# Patient Record
Sex: Female | Born: 1970 | Race: White | Hispanic: No | Marital: Married | Smoking: Never smoker
Health system: Southern US, Community
[De-identification: ages and names within clinical notes are randomized; demographics above are authoritative.]

## PROBLEM LIST (undated history)

## (undated) DIAGNOSIS — E039 Hypothyroidism, unspecified: Secondary | ICD-10-CM

## (undated) DIAGNOSIS — E785 Hyperlipidemia, unspecified: Secondary | ICD-10-CM

## (undated) DIAGNOSIS — I10 Essential (primary) hypertension: Secondary | ICD-10-CM

## (undated) DIAGNOSIS — T7840XA Allergy, unspecified, initial encounter: Secondary | ICD-10-CM

## (undated) DIAGNOSIS — F410 Panic disorder [episodic paroxysmal anxiety] without agoraphobia: Secondary | ICD-10-CM

## (undated) DIAGNOSIS — K219 Gastro-esophageal reflux disease without esophagitis: Secondary | ICD-10-CM

## (undated) DIAGNOSIS — E669 Obesity, unspecified: Secondary | ICD-10-CM

## (undated) DIAGNOSIS — J302 Other seasonal allergic rhinitis: Secondary | ICD-10-CM

## (undated) DIAGNOSIS — E079 Disorder of thyroid, unspecified: Secondary | ICD-10-CM

## (undated) HISTORY — DX: Hyperlipidemia, unspecified: E78.5

## (undated) HISTORY — DX: Disorder of thyroid, unspecified: E07.9

## (undated) HISTORY — DX: Panic disorder (episodic paroxysmal anxiety): F41.0

## (undated) HISTORY — DX: Gastro-esophageal reflux disease without esophagitis: K21.9

## (undated) HISTORY — DX: Obesity, unspecified: E66.9

---
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
## 2006-08-08 ENCOUNTER — Other Ambulatory Visit: Admission: RE | Admit: 2006-08-08 | Discharge: 2006-08-08 | Payer: Self-pay | Admitting: Family Medicine

## 2008-06-29 ENCOUNTER — Encounter: Payer: Self-pay | Admitting: Family Medicine

## 2008-06-29 ENCOUNTER — Encounter (INDEPENDENT_AMBULATORY_CARE_PROVIDER_SITE_OTHER): Payer: Self-pay | Admitting: *Deleted

## 2008-06-29 LAB — CONVERTED CEMR LAB
AST: 21 units/L
Albumin: 3.8 g/dL
Alkaline Phosphatase: 62 units/L
Chloride, Serum: 107 mmol/L
Creatinine, Ser: 0.9 mg/dL
MCH: 34.5 pg
MCV: 86.6 fL
Platelets: 248 10*3/uL
Potassium, serum: 5.9 mmol/L
TSH: 4.15 microintl units/mL
Total Bilirubin: 0.8 mg/dL
Total Protein: 7.2 g/dL

## 2008-08-15 ENCOUNTER — Encounter (INDEPENDENT_AMBULATORY_CARE_PROVIDER_SITE_OTHER): Payer: Self-pay | Admitting: *Deleted

## 2008-08-31 ENCOUNTER — Ambulatory Visit: Payer: Self-pay | Admitting: Family Medicine

## 2008-08-31 DIAGNOSIS — R7301 Impaired fasting glucose: Secondary | ICD-10-CM

## 2008-08-31 DIAGNOSIS — E875 Hyperkalemia: Secondary | ICD-10-CM

## 2008-08-31 DIAGNOSIS — K219 Gastro-esophageal reflux disease without esophagitis: Secondary | ICD-10-CM | POA: Insufficient documentation

## 2008-08-31 DIAGNOSIS — E559 Vitamin D deficiency, unspecified: Secondary | ICD-10-CM | POA: Insufficient documentation

## 2008-08-31 LAB — CONVERTED CEMR LAB
BUN: 15 mg/dL (ref 6–23)
Calcium: 9.1 mg/dL (ref 8.4–10.5)
Creatinine, Ser: 0.8 mg/dL (ref 0.4–1.2)
GFR calc non Af Amer: 84.37 mL/min (ref >60)
Hgb A1c MFr Bld: 5.8 % (ref 4.6–6.5)

## 2008-09-01 ENCOUNTER — Encounter (INDEPENDENT_AMBULATORY_CARE_PROVIDER_SITE_OTHER): Payer: Self-pay | Admitting: *Deleted

## 2008-09-01 ENCOUNTER — Telehealth (INDEPENDENT_AMBULATORY_CARE_PROVIDER_SITE_OTHER): Payer: Self-pay | Admitting: *Deleted

## 2008-09-08 ENCOUNTER — Encounter (INDEPENDENT_AMBULATORY_CARE_PROVIDER_SITE_OTHER): Payer: Self-pay | Admitting: *Deleted

## 2008-12-16 ENCOUNTER — Ambulatory Visit: Payer: Self-pay | Admitting: Family Medicine

## 2008-12-20 ENCOUNTER — Telehealth: Payer: Self-pay | Admitting: Family Medicine

## 2008-12-21 ENCOUNTER — Encounter: Payer: Self-pay | Admitting: Family Medicine

## 2008-12-22 ENCOUNTER — Telehealth: Payer: Self-pay | Admitting: Family Medicine

## 2009-05-08 ENCOUNTER — Telehealth (INDEPENDENT_AMBULATORY_CARE_PROVIDER_SITE_OTHER): Payer: Self-pay | Admitting: *Deleted

## 2009-06-26 ENCOUNTER — Encounter: Payer: Self-pay | Admitting: Family Medicine

## 2009-09-04 ENCOUNTER — Ambulatory Visit: Payer: Self-pay | Admitting: Family Medicine

## 2009-09-04 DIAGNOSIS — R209 Unspecified disturbances of skin sensation: Secondary | ICD-10-CM | POA: Insufficient documentation

## 2009-09-04 DIAGNOSIS — R0609 Other forms of dyspnea: Secondary | ICD-10-CM

## 2009-09-04 DIAGNOSIS — F411 Generalized anxiety disorder: Secondary | ICD-10-CM | POA: Insufficient documentation

## 2009-09-04 DIAGNOSIS — R0989 Other specified symptoms and signs involving the circulatory and respiratory systems: Secondary | ICD-10-CM

## 2009-09-08 ENCOUNTER — Ambulatory Visit: Payer: Self-pay | Admitting: Family Medicine

## 2009-09-08 LAB — CONVERTED CEMR LAB
BUN: 18 mg/dL (ref 6–23)
Basophils Absolute: 0 10*3/uL (ref 0.0–0.1)
Bilirubin, Direct: 0.2 mg/dL (ref 0.0–0.3)
CO2: 26 meq/L (ref 19–32)
Calcium: 9.2 mg/dL (ref 8.4–10.5)
Chloride: 106 meq/L (ref 96–112)
Cholesterol: 191 mg/dL (ref 0–200)
Creatinine, Ser: 0.7 mg/dL (ref 0.4–1.2)
Eosinophils Absolute: 0.2 10*3/uL (ref 0.0–0.7)
HDL: 44.2 mg/dL (ref >39.00)
LDL Cholesterol: 127 mg/dL — ABNORMAL HIGH (ref 0–99)
Lymphocytes Relative: 22.6 % (ref 12.0–46.0)
MCHC: 34.1 g/dL (ref 30.0–36.0)
MCV: 87.1 fL (ref 78.0–100.0)
Monocytes Absolute: 0.3 10*3/uL (ref 0.1–1.0)
Neutrophils Relative %: 69.3 % (ref 43.0–77.0)
Platelets: 206 10*3/uL (ref 150.0–400.0)
RBC: 4.59 M/uL (ref 3.87–5.11)
TSH: 3.5 microintl units/mL (ref 0.35–5.50)
Total Bilirubin: 0.9 mg/dL (ref 0.3–1.2)
Total CHOL/HDL Ratio: 4
Triglycerides: 101 mg/dL (ref 0.0–149.0)
VLDL: 20.2 mg/dL (ref 0.0–40.0)

## 2009-09-12 ENCOUNTER — Telehealth (INDEPENDENT_AMBULATORY_CARE_PROVIDER_SITE_OTHER): Payer: Self-pay | Admitting: *Deleted

## 2009-10-02 ENCOUNTER — Ambulatory Visit: Payer: Self-pay | Admitting: Family Medicine

## 2009-10-02 DIAGNOSIS — E119 Type 2 diabetes mellitus without complications: Secondary | ICD-10-CM

## 2009-10-02 DIAGNOSIS — E785 Hyperlipidemia, unspecified: Secondary | ICD-10-CM

## 2009-10-02 DIAGNOSIS — E1169 Type 2 diabetes mellitus with other specified complication: Secondary | ICD-10-CM

## 2009-11-14 ENCOUNTER — Encounter: Payer: Self-pay | Admitting: Family Medicine

## 2009-11-16 ENCOUNTER — Encounter (INDEPENDENT_AMBULATORY_CARE_PROVIDER_SITE_OTHER): Payer: Self-pay | Admitting: *Deleted

## 2009-12-01 ENCOUNTER — Ambulatory Visit: Payer: Self-pay | Admitting: Family Medicine

## 2009-12-04 LAB — CONVERTED CEMR LAB
ALT: 15 units/L (ref 0–35)
AST: 27 units/L (ref 0–37)
Albumin: 4.5 g/dL (ref 3.5–5.2)
Bilirubin, Direct: 0.2 mg/dL (ref 0.0–0.3)
Hgb A1c MFr Bld: 5.7 % — ABNORMAL HIGH (ref <5.7)
Total Protein: 7.1 g/dL (ref 6.0–8.3)

## 2009-12-20 ENCOUNTER — Encounter
Admission: RE | Admit: 2009-12-20 | Discharge: 2010-02-13 | Payer: Self-pay | Source: Home / Self Care | Attending: Family Medicine | Admitting: Family Medicine

## 2009-12-20 ENCOUNTER — Encounter: Payer: Self-pay | Admitting: Family Medicine

## 2010-02-14 NOTE — Assessment & Plan Note (Signed)
Summary: 4 WEEK FOLLOWUP APPT///SP[facility]   Vital Signs:  Patient profile:   40 year old female Height:      65 inches (165.10 cm) Weight:      290.38 pounds (131.99 kg) BMI:     48.50 Temp:     98.8 degrees F (37.11 degrees C) oral BP sitting:   112 / 70  (left arm) Cuff size:   large  Vitals Entered By: Lucious Groves CMA (October 02, 2009 4:09 PM) CC: F/U to discuss DM and anxiety./kb Is Patient Diabetic? Yes Pain Assessment Patient in pain? no      Comments Patient notes that she feels like her anxiety is "getting there"./kb   History of Present Illness: 40 yo woman here today for  1) anxiety- pt's husband has noticed improvement, pt feels calmer.  still having catastrophic thinking but able to 'let it go'.  has not called therapist.  denies SI/HI.  tolerating meds w/out side effects.  less crying, reports better composure- evident in office today.  2) DM- pt familiar w/ the dx and has already made dramatic dietary changes, resulting in weight loss.  insurance covers appts w/ nutrition and would like to take advantage of this.  would like to manage dz w/ diet and exercise if possible.  3) Hyperlipidemia- started Simvastatin w/out difficulty.  no N/V, abd pain, myalgias.  given DM, pt's goal is <70.  Problems Prior to Update: 1)  Diabetes-type 2  (ICD-250.00) 2)  Anxiety State, Unspecified  (ICD-300.00) 3)  Paresthesia  (ICD-782.0) 4)  Snoring  (ICD-786.09) 5)  Impaired Fasting Glucose  (ICD-790.21) 6)  Hyperkalemia  (ICD-276.7) 7)  Vitamin D Deficiency  (ICD-268.9) 8)  Gerd  (ICD-530.81)  Current Medications (verified): 1)  Omeprazole 20 Mg Cpdr (Omeprazole) .Marland Kitchen.. 1 By Mouth Qam 2)  Calcium 500 +d 500-400 Mg-Unit Tabs (Calcium-Vitamin D) .... Take 2 Tablets Daily 3)  Claritin-D 24 Hour 10-240 Mg Xr24h-Tab (Loratadine-Pseudoephedrine) .... Take One Tablet Daily 4)  Citalopram Hydrobromide 20 Mg Tabs (Citalopram Hydrobromide) .... Take One Tablet By Mouth Daily 5)   Simvastatin 40 Mg Tabs (Simvastatin) .... Take One Tablet At Bedtime  Allergies (verified): No Known Drug Allergies  Past History:  Past Medical History: GERD Vitamin D def Obesity DM- dx'd 9/11 hyperlipidemia  Review of Systems      See HPI  Physical Exam  General:  Well-developed,well-nourished,in no acute distress; alert,appropriate and cooperative throughout examination.  obese Neck:  No deformities, masses, or tenderness noted. Lungs:  Normal respiratory effort, chest expands symmetrically. Lungs are clear to auscultation, no crackles or wheezes. Heart:  Normal rate and regular rhythm. S1 and S2 normal without gallop, murmur, click, rub or other extra sounds. Psych:  much less anxious.  better composed.   Impression & Recommendations:  Problem # 1:  DIABETES-TYPE 2 (ICD-250.00) Assessment New discussed meaning of dx, general nutrition principles, importance of exercise.  need for eye exam yearly.  reviewed schedule of visits- quarterly.  will refer to nutrition.  no need for meds at this time.  will attempt control via diet and exercise. Orders: Nutrition Referral (Nutrition)  Problem # 2:  HYPERLIPIDEMIA (ICD-272.4) Assessment: New given new dx of DM, goal is now <70.  pt tolerating Simvastatin w/out difficulty.  will follow closely. Her updated medication list for this problem includes:    Simvastatin 40 Mg Tabs (Simvastatin) .Marland Kitchen... Take one tablet at bedtime  Problem # 3:  ANXIETY STATE, UNSPECIFIED (ICD-300.00) Assessment: Improved no side effects from SSRI.  family reporting improvement.  pt acknowledges improvement as well.  again encouraged pt to start counseling.  will follow closely. Her updated medication list for this problem includes:    Citalopram Hydrobromide 20 Mg Tabs (Citalopram hydrobromide) .Marland Kitchen... Take one tablet by mouth daily  Complete Medication List: 1)  Omeprazole 20 Mg Cpdr (Omeprazole) .Marland Kitchen.. 1 by mouth qam 2)  Calcium 500 +d 500-400 Mg-unit  Tabs (Calcium-vitamin d) .... Take 2 tablets daily 3)  Claritin-d 24 Hour 10-240 Mg Xr24h-tab (Loratadine-pseudoephedrine) .... Take one tablet daily 4)  Citalopram Hydrobromide 20 Mg Tabs (Citalopram hydrobromide) .... Take one tablet by mouth daily 5)  Simvastatin 40 Mg Tabs (Simvastatin) .... Take one tablet at bedtime  Patient Instructions: 1)  Please schedule a follow-up appointment in 2 months for your complete physical- you do not need to fast for this. 2)  Keep up the great work on diet and exercise!  I'm so proud of you! 3)  Someone will call you with your nutrition appt 4)  Call with any questions or concerns- that's what I'm here for! 5)  Continue your Citalopram daily 6)  Take the Simvastatin as directed at night 7)  At the next visit we'll do urine to check for protein and a complete foot exam, along w/ GYN 8)  Hang in there!!!  You're doing great! 9)

## 2010-02-14 NOTE — Letter (Signed)
Summary: Care Consideration Regarding Sleep Apnea/[facility]  Care Consideration Regarding Sleep Apnea/ [facility]   Imported By: Lanelle Bal 07/12/2009 10:29:08  _____________________________________________________________________  External Attachment:    Type:   Image     Comment:   External Document

## 2010-02-14 NOTE — Miscellaneous (Signed)
  Clinical Lists Changes  Observations: Added new observation of FLU VAX: Historical (11/14/2009 9:01)      Immunization History:  Influenza Immunization History:    Influenza:  historical (11/14/2009)

## 2010-02-14 NOTE — Assessment & Plan Note (Signed)
Summary: CPX--DOESNT NEED LABS///SP[facility]   Vital Signs:  Patient profile:   40 year old female Height:      65 inches Weight:      288 pounds BMI:     48.10 Pulse rate:   82 / minute BP sitting:   108 / 78  (left arm)  Vitals Entered By: Doristine Devoid CMA (December 01, 2009 3:38 PM) CC: CPX    History of Present Illness: 40 yo woman here today for CPE.  no concerns about health.  overdue on mammogram.  started period today so no pap.  Preventive Screening-Counseling & Management  Alcohol-Tobacco     Alcohol drinks/day: 0     Smoking Status: never  Caffeine-Diet-Exercise     Does Patient Exercise: no      Sexual History:  currently monogamous.        Drug Use:  never.    Current Medications (verified): 1)  Omeprazole 20 Mg Cpdr (Omeprazole) .Marland Kitchen.. 1 By Mouth Qam 2)  Calcium 500 +d 500-400 Mg-Unit Tabs (Calcium-Vitamin D) .... Take 2 Tablets Daily 3)  Claritin-D 24 Hour 10-240 Mg Xr24h-Tab (Loratadine-Pseudoephedrine) .... Take One Tablet Daily 4)  Citalopram Hydrobromide 20 Mg Tabs (Citalopram Hydrobromide) .... Take One Tablet By Mouth Daily 5)  Simvastatin 40 Mg Tabs (Simvastatin) .... Take One Tablet At Bedtime 6)  Tylenol Pm Extra Strength 500-25 Mg Tabs (Diphenhydramine-Apap (Sleep)) .... 2 Tablets As Needed  Allergies (verified): No Known Drug Allergies  Past History:  Past medical, surgical, family and social histories (including risk factors) reviewed, and no changes noted (except as noted below).  Past Medical History: Reviewed history from 10/02/2009 and no changes required. GERD Vitamin D def Obesity DM- dx'd 9/11 hyperlipidemia  Past Surgical History: Reviewed history from 08/31/2008 and no changes required. none  Family History: Reviewed history from 08/31/2008 and no changes required. CAD-no HTN-no DM-maternal and paternal grandparents STROKE-no COLON CA-no BREAST CA-no  Social History: Reviewed history from 08/31/2008 and no changes  required. married, 2 children (95,00) Runner, broadcasting/film/video at [employer]  Review of Systems  The patient denies anorexia, fever, weight loss, weight gain, vision loss, decreased hearing, hoarseness, chest pain, syncope, dyspnea on exertion, peripheral edema, prolonged cough, headaches, abdominal pain, melena, hematochezia, severe indigestion/heartburn, hematuria, suspicious skin lesions, depression, abnormal bleeding, enlarged lymph nodes, and breast masses.    Physical Exam  General:  Well-developed,well-nourished,in no acute distress; alert,appropriate and cooperative throughout examination.  obese Head:  Normocephalic and atraumatic without obvious abnormalities. No apparent alopecia or balding. Eyes:  No corneal or conjunctival inflammation noted. EOMI. Perrla. Funduscopic exam benign, without hemorrhages, exudates or papilledema. Vision grossly normal. Ears:  External ear exam shows no significant lesions or deformities.  Otoscopic examination reveals clear canals, tympanic membranes are intact bilaterally without bulging, retraction, inflammation or discharge. Hearing is grossly normal bilaterally. Nose:  External nasal examination shows no deformity or inflammation. Nasal mucosa are pink and moist without lesions or exudates. Mouth:  Oral mucosa and oropharynx without lesions or exudates.  Teeth in good repair. Neck:  No deformities, masses, or tenderness noted. Breasts:  No mass, nodules, thickening, tenderness, bulging, retraction, inflamation, nipple discharge or skin changes noted.   Lungs:  Normal respiratory effort, chest expands symmetrically. Lungs are clear to auscultation, no crackles or wheezes. Heart:  Normal rate and regular rhythm. S1 and S2 normal without gallop, murmur, click, rub or other extra sounds. Abdomen:  Bowel sounds positive,abdomen soft and non-tender without masses, organomegaly or hernias noted. Genitalia:  deferred due to menses Pulses:  +2 carotid, radial,  DP Extremities:  no C/C/E Neurologic:  No cranial nerve deficits noted. Station and gait are normal. Plantar reflexes are down-going bilaterally. DTRs are symmetrical throughout. Sensory, motor and coordinative functions appear intact. Skin:  Intact without suspicious lesions or rashes Cervical Nodes:  No lymphadenopathy noted Axillary Nodes:  No palpable lymphadenopathy Psych:  Cognition and judgment appear intact. Alert and cooperative with normal attention span and concentration. No apparent delusions, illusions, hallucinations   Impression & Recommendations:  Problem # 1:  PHYSICAL EXAMINATION (ICD-V70.0) Assessment New pt's PE WNL w/ exception of obesity.  will refer for mammogram.  anticipatory guidance provided.  Problem # 2:  HYPERLIPIDEMIA (ICD-272.4) Assessment: Unchanged due for f/u LFTs after starting statin. Her updated medication list for this problem includes:    Simvastatin 40 Mg Tabs (Simvastatin) .Marland Kitchen... Take one tablet at bedtime  Orders: Specimen Handling (13244)  Problem # 3:  DIABETES-TYPE 2 (ICD-250.00) Assessment: Unchanged due for A1C Orders: Venipuncture (01027) Specimen Handling (25366)  Complete Medication List: 1)  Omeprazole 20 Mg Cpdr (Omeprazole) .Marland Kitchen.. 1 by mouth qam 2)  Calcium 500 +d 500-400 Mg-unit Tabs (Calcium-vitamin d) .... Take 2 tablets daily 3)  Claritin-d 24 Hour 10-240 Mg Xr24h-tab (Loratadine-pseudoephedrine) .... Take one tablet daily 4)  Citalopram Hydrobromide 20 Mg Tabs (Citalopram hydrobromide) .... Take one tablet by mouth daily 5)  Simvastatin 40 Mg Tabs (Simvastatin) .... Take one tablet at bedtime 6)  Tylenol Pm Extra Strength 500-25 Mg Tabs (Diphenhydramine-apap (sleep)) .... 2 tablets as needed  Patient Instructions: 1)  Follow up in 3 months to recheck your diabetes and cholesterol- do not eat before this appt 2)  Schedule your pap at your convenience 3)  We'll notify you of your lab results 4)  Keep up the good work on  diet, try and get regular exercise! 5)  Call with any questions or concerns 6)  We'll call you with your mammogram appt 7)  Happy Holidays!!! Prescriptions: SIMVASTATIN 40 MG TABS (SIMVASTATIN) take one tablet at bedtime  #30 x 11   Entered and Authorized by:   Neena Rhymes MD   Signed by:   Neena Rhymes MD on 12/01/2009   Method used:   Electronically to        [facility] #[id number]* (retail)       [address]       [place], [place]  [address]       Ph: [phone]       Fax: [phone]   RxID:   [id number] CITALOPRAM HYDROBROMIDE 20 MG TABS (CITALOPRAM HYDROBROMIDE) take one tablet by mouth daily  #30 x 11   Entered and Authorized by:   Neena Rhymes MD   Signed by:   Neena Rhymes MD on 12/01/2009   Method used:   Electronically to        [facility] #[id number]* (retail)       [address]       [place], [place]  [address]       Ph: [phone]       Fax: [phone]   RxID:   [id number] OMEPRAZOLE 20 MG CPDR (OMEPRAZOLE) 1 by mouth qam  #30 x 11   Entered and Authorized by:   Neena Rhymes MD   Signed by:   Neena Rhymes MD on 12/01/2009   Method  used:   Electronically to        [facility] #[id number]* (retail)       [address]       [place], [place]  [address]       Ph: [phone]       Fax: [phone]   RxID:   [id number]    Orders Added: 1)  Venipuncture [72536] 2)  Specimen Handling [99000] 3)  Est. Patient 40-64 years [phone]

## 2010-02-14 NOTE — Progress Notes (Signed)
Summary: pt ok prilosec otc  Phone Note Refill Request Call back at [phone] Message from:  Pharmacy on May 08, 2009 10:56 AM  Refills Requested: Medication #1:  OMEPRAZOLE 20 MG CPDR 1 by mouth qam   Dosage confirmed as above?Dosage Confirmed   Supply Requested: 1 month   Last Refilled: 03/27/2009 Prior Auth [id number] ID#: [id number]  Next Appointment Scheduled: none Initial call taken by: Harold Barban,  May 08, 2009 10:57 AM  Follow-up for Phone Call        prior Berkley Harvey was denied back in December pt is aware, pt is to have tried and failed  OTC Prilosec and pt is aware per 12/0/10 office ntoes.  Lmom for pt to call  .Kandice Hams  May 09, 2009 12:57 PM  patient called says she is ok w/ taking otc prilosec says she has taking it in the past so this won't be a problem...Marland KitchenMarland KitchenDoristine Devoid  May 09, 2009 4:17 PM

## 2010-02-14 NOTE — Assessment & Plan Note (Signed)
Summary: concerns about snoring and anxiety/kn   Vital Signs:  Patient profile:   40 year old female Height:      65 inches Weight:      297 pounds BMI:     49.60 Pulse rate:   102 / minute BP sitting:   140 / 90  (left arm)  Vitals Entered By: Doristine Devoid CMA (September 04, 2009 3:45 PM) CC: Concerns about snoring, numbness in arms and legs, and some anxiety issues    History of Present Illness: 40 yo woman here today for   1) snoring- husband is complaining about pt's snoring.  denies apnic episodes.  denies daytime sleepiness or waking feeling poorly rested.  2) numbness- occurs when pt lies down at night.  has 'pricklies' in fingers and legs.  occuring bilaterally.  not occuring nightly but 'regularly'.  'easily 3' out of 7 nights.  sxs are gone by the time pt wakes up.  when it occurs it 'sends me into a panic mode'.  3) anxiety- had 1 'full fledged panic attack'.  heart racing, SOB, 'whole body numbness'.  'i'm spending a lot of time thinking about dying in my sleep'.  no hx of anxiety or depression previously.  anxiety sxs 'for most of this calendar year'.  worsening in severity.  denies any change in stressors w/ exception of daughter now driving.  lost grandmother in November.  reports she's thinking about death 'all the time'.  Preventive Screening-Counseling & Management  Caffeine-Diet-Exercise     Does Patient Exercise: no  Current Medications (verified): 1)  Omeprazole 20 Mg Cpdr (Omeprazole) .Marland Kitchen.. 1 By Mouth Qam 2)  Calcium 500 +d 500-400 Mg-Unit Tabs (Calcium-Vitamin D) .... Take 2 Tablets Daily 3)  Claritin-D 24 Hour 10-240 Mg Xr24h-Tab (Loratadine-Pseudoephedrine) .... Take One Tablet Daily 4)  Citalopram Hydrobromide 20 Mg Tabs (Citalopram Hydrobromide) .... Take One Tablet By Mouth Daily  Allergies (verified): No Known Drug Allergies  Past History:  Social History: Last updated: 08/31/2008 married, 2 children (95,00) Runner, broadcasting/film/video at [employer]  Past  Medical History: GERD Vitamin D def Obesity  Review of Systems      See HPI  Physical Exam  General:  Well-developed,well-nourished,in no acute distress; alert,appropriate and cooperative throughout examination.  obese Mouth:  crowded post pharynx Neck:  No deformities, masses, or tenderness noted. Lungs:  Normal respiratory effort, chest expands symmetrically. Lungs are clear to auscultation, no crackles or wheezes. Heart:  Normal rate and regular rhythm. S1 and S2 normal without gallop, murmur, click, rub or other extra sounds. Pulses:  +2 carotid, radial, DP Extremities:  no C/C/E Neurologic:  No cranial nerve deficits noted. Station and gait are normal. Plantar reflexes are down-going bilaterally. DTRs are symmetrical throughout. Sensory, motor and coordinative functions appear intact. Psych:  very anxious.  intermittantly tearful   Impression & Recommendations:  Problem # 1:  SNORING (ICD-786.09) Assessment New  given habitus and crowded post pharynx will refer to pulm for sleep study.  pt in agreement.  Orders: Pulmonary Referral (Pulmonary)  Problem # 2:  PARESTHESIA (ICD-782.0) Assessment: New  likely related to the way pt is sleeping as it only occurs at night.  will refer for nerve conduction study.  Orders: Neurology Referral (Neuro)  Problem # 3:  ANXIETY STATE, UNSPECIFIED (ICD-300.00) Assessment: New this is pt's biggest problem.  based on her sxs it sounds as if she wouldn't leave the house unless she had to work, preoccupied w/ death.  #s given for counseling.  start SSRI.  follow closely. Her updated medication list for this problem includes:    Citalopram Hydrobromide 20 Mg Tabs (Citalopram hydrobromide) .Marland Kitchen... Take one tablet by mouth daily  Complete Medication List: 1)  Omeprazole 20 Mg Cpdr (Omeprazole) .Marland Kitchen.. 1 by mouth qam 2)  Calcium 500 +d 500-400 Mg-unit Tabs (Calcium-vitamin d) .... Take 2 tablets daily 3)  Claritin-d 24 Hour 10-240 Mg Xr24h-tab  (Loratadine-pseudoephedrine) .... Take one tablet daily 4)  Citalopram Hydrobromide 20 Mg Tabs (Citalopram hydrobromide) .... Take one tablet by mouth daily  Patient Instructions: 1)  Please schedule a follow-up appointment in 3-4 weeks to recheck anxiety. 2)  Please schedule a fasting lab visit 3)  BMP- 278.01 4)  CBC- 782.0 5)  TSH- 782.0 6)  FLP- 278.01 7)  LFT- 278.01 8)  Start the Citalopram daily.  If you find it is causing fatigue, take it at night 9)  Someone will call you with your sleep and nerve conduction study appts 10)  Please call and establish counseling- this is the most important part 11)  Try and find a stress outlet- walking, painting, etc 12)  Hang in there!!!  Prescriptions: CITALOPRAM HYDROBROMIDE 20 MG TABS (CITALOPRAM HYDROBROMIDE) take one tablet by mouth daily  #30 x 3   Entered and Authorized by:   Neena Rhymes MD   Signed by:   Neena Rhymes MD on 09/04/2009   Method used:   Electronically to        [facility] #[id number]* (retail)       [address]       [place], [place]  [address]       Ph: [phone]       Fax: [phone]   RxID:   [id number]

## 2010-02-14 NOTE — Miscellaneous (Signed)
Summary[facility]   Imported By: Lanelle Bal 11/21/2009 13:15:22  _____________________________________________________________________  External Attachment:    Type:   Image     Comment:   External Document

## 2010-02-14 NOTE — Progress Notes (Signed)
Summary: labs  Phone Note Outgoing Call   Call placed by: Doristine Devoid CMA,  September 12, 2009 9:36 AM Call placed to: Patient Summary of Call: f pt is truly diabetic will need to start cholesterol med.  if not, her LDL is mildly elevated and she should work on diet and exercise.  repeat labs in 6 months pt is officially diabetic.  due to this, will need to start Simvastatin 40mg  nightly for her cholesterol (goal is now <70).  need to recheck LFTs in 6-8 weeks.  if pt doesn't have an appt scheduled to follow up, will need to schedule to discuss diagnosis new dx   Follow-up for Phone Call        spoke w/ patient aware of labs and that medication is to be started for cholesterol and has upcoming appt to discuss further.....Marland KitchenMarland KitchenDoristine Devoid CMA  September 13, 2009 12:18 PM     New/Updated Medications: SIMVASTATIN 40 MG TABS (SIMVASTATIN) take one tablet at bedtime Prescriptions: SIMVASTATIN 40 MG TABS (SIMVASTATIN) take one tablet at bedtime  #30 x 3   Entered by:   Doristine Devoid CMA   Authorized by:   Neena Rhymes MD   Signed by:   Doristine Devoid CMA on 09/13/2009   Method used:   Electronically to        [facility] #[id number]* (retail)       [address]       [place], [place]  [address]       Ph: [phone]       Fax: [phone]   RxID:   [id number]

## 2010-02-15 NOTE — Letter (Signed)
Summary: [facility]  [facility]   Imported By: Lanelle Bal 12/30/2009 08:46:45  _____________________________________________________________________  External Attachment:    Type:   Image     Comment:   External Document

## 2010-02-24 ENCOUNTER — Encounter: Payer: Self-pay | Admitting: Family Medicine

## 2010-03-05 ENCOUNTER — Ambulatory Visit: Payer: Self-pay | Admitting: Family Medicine

## 2010-03-07 ENCOUNTER — Telehealth: Payer: Self-pay | Admitting: Family Medicine

## 2010-03-09 ENCOUNTER — Encounter: Payer: Self-pay | Admitting: Family Medicine

## 2010-03-20 ENCOUNTER — Encounter (INDEPENDENT_AMBULATORY_CARE_PROVIDER_SITE_OTHER): Payer: Self-pay | Admitting: *Deleted

## 2010-03-22 NOTE — Medication Information (Signed)
Summary: PA and Denial for Omeprazole  PA and Denial for Omeprazole   Imported By: Maryln Gottron 03/16/2010 14:03:56  _____________________________________________________________________  External Attachment:    Type:   Image     Comment:   External Document

## 2010-03-26 ENCOUNTER — Telehealth: Payer: Self-pay | Admitting: Family Medicine

## 2010-03-27 NOTE — Letter (Signed)
Summary: Generic Letter  [facility]  [address] [place], [place] [address]   Phone: [phone]  Fax: [phone]    03/20/2010   Valinda Hoar [place] of [facility] Dept/Provider Courtesy Review [address] [address] [place], [place] [address]  ICA DAYE DOB:02/02/2070  IR:[id number] appeal YT:[id number] [address] [place], [place]  [address]  ATTENTION APPEALS DEPARTMENT:  Mrs. Ishii has diagnosis of GERD. The Omeprazole has been therapeutic in treating the patient GERD. The patient has tried over-the-counter medications such as prilosec with no relief. The Patient requires daily dosing of Omeprazole in order to remain asymptomatic.Please reconsider your decision on coverage for Omeprazole.        Sincerely,       Neena Rhymes

## 2010-03-27 NOTE — Progress Notes (Signed)
Summary: -prior auth DENIED OMEPRAZOLE appeal needed  Phone Note Refill Request Message from:  Fax from Pharmacy on March 07, 2010 11:55 AM  Refills Requested: Medication #1:  OMEPRAZOLE 20 MG CPDR 1 by mouth qam prior Berkley Harvey - [id number] - pt id [id number]  Initial call taken by: Okey Regal [place],  March 07, 2010 11:56 AM  Follow-up for Phone Call        Case FA:[id number], awaiting fax.Marland KitchenMarland KitchenFelecia Deloach CMA  March 08, 2010 11:48 AM  PA faxed back awaiting response...Marland KitchenMarland KitchenFelecia Deloach CMA  March 08, 2010 3:21 PM   Additional Follow-up for Phone Call Additional follow up Details #1::         Reason for denial :Coverage beyond 90 days per 180 days period is provided for severe/atypical gerd with related disorders, moderate gerd with daily/disabling symptoms having failed a 30 day trail of high-dose H2 blockers,or peptic ulcer disease, Barrett's esophagus or hypersecretory conditions, or prevention of NSAID or steroid related ulcer.     Additional Follow-up for Phone Call Additional follow up Details #2::    please ask pt if she is having daily sxs Follow-up by: Neena Rhymes MD,  March 14, 2010 5:18 PM  Additional Follow-up for Phone Call Additional follow up Details #3:: Details for Additional Follow-up Action Taken: Left message to call office...........Marland KitchenFelecia Deloach CMA  March 15, 2010 5:19 PM  Left message to call office ...........Marland KitchenFelecia Deloach CMA  March 19, 2010 8:33 AM   Pt states that she has been experiencing symptoms daily. Pt notes that she has been using the OTC brand, but it does not seem to work as good as the Rx. Pls advise..........Marland KitchenFelecia Deloach CMA  March 19, 2010 4:57 PM   now that pt reports daily sxs she should meet the criteria for the prior authorization.  please see if they will now accept this script.  Neena Rhymes MD  March 20, 2010 8:04 AM.  Prior Berkley Harvey was already initiated and denied see 03-09-10 PA denial folder. If Pt is needing med  a letter of appeal will be required in order to get med approve maybe.........Marland KitchenFelecia Deloach CMA  March 20, 2010 11:46 AM   letter of appeal signed.  will await decision.  Neena Rhymes MD  March 20, 2010 12:59 PM.

## 2010-03-30 ENCOUNTER — Telehealth: Payer: Self-pay | Admitting: Family Medicine

## 2010-04-03 NOTE — Progress Notes (Signed)
Summary: Prior Auth--Pending appeal  Phone Note Refill Request Call back at [phone] Message from:  Pharmacy on March 26, 2010 2:23 PM  Refills Requested: Medication #1:  OMEPRAZOLE 20 MG CPDR 1 by mouth qam   Dosage confirmed as above?Dosage Confirmed   Supply Requested: 1 month Prior Auth : [id number] Patient ID: [id number]  Next Appointment Scheduled: 4.9.12 Initial call taken by: Lavell Islam,  March 26, 2010 2:24 PM  Follow-up for Phone Call        Auth was denied and letter was sent just 6 days ago. Letters for re-consideration usually take 2+ weeks. Lucious Groves CMA  March 26, 2010 3:18 PM     I spoke with [employer] and not enough time has elapsed, they could not tell me any additional info about the status of this request for re-consideration. Lucious Groves CMA  March 26, 2010 3:39 PM   Additional Follow-up for Phone Call Additional follow up Details #1::        I spoke with [employer] to check status again and rep stated that they do not having anything in their system about an appeal. He notes that they do not do the appeals and we would have to contact [employer] appeals for this issue. I faxed appeal letter to number on denial letter to try and achieve faster processing.  Additional Follow-up by: Lucious Groves CMA,  March 30, 2010 11:03 AM

## 2010-04-06 ENCOUNTER — Other Ambulatory Visit: Payer: Self-pay | Admitting: Family Medicine

## 2010-04-06 NOTE — Telephone Encounter (Signed)
I spoke with the pt last week and she purchases this rx out of pocket. Insurance has denied to pay for it.

## 2010-04-06 NOTE — Telephone Encounter (Signed)
This is in regards to her Omeprazole.

## 2010-04-09 ENCOUNTER — Other Ambulatory Visit: Payer: Self-pay | Admitting: Family Medicine

## 2010-04-09 MED ORDER — OMEPRAZOLE 20 MG PO TBEC: 20.0000 mg | DELAYED_RELEASE_TABLET | Freq: Every day | ORAL | Status: DC

## 2010-04-09 NOTE — Telephone Encounter (Signed)
MD previously sent 40mg  to see if it would require prior auth and it did. Pt will take 20mg  and pay for it out of pocket because ins co has denied coverage on initial prior auth AND appeal.

## 2010-04-11 ENCOUNTER — Other Ambulatory Visit: Payer: Self-pay | Admitting: Family Medicine

## 2010-04-11 NOTE — Telephone Encounter (Signed)
Left message on voicemail at the pharmacy to fill rx from yesterday. Prior auth cannot be done, auth and appeal have been denied by ins co and pt is aware. Pt pays for this med out of pocket.

## 2010-04-12 NOTE — Progress Notes (Signed)
Summary: Omeprazole  Phone Note Outgoing Call   Summary of Call: Patient insurance company is refusing to pay for Omeprazole. Patient takes 20mg  once daily. Per Dr. Beverely Low I will send adjusted prescription to see if insurance kicks back at pharmacy.  Left message on machine to call back to office. Initial call taken by: Lucious Groves CMA,  March 30, 2010 11:45 AM  Follow-up for Phone Call        Patient left message on voicemail that she is aware of the above and has purchased otc product. I made her aware that we changed the prescription a bit and will try to get it covered this way. She expressed understanding and thanked [place] for trying.   *At this time i do not have reject from pharmacy. Follow-up by: Lucious Groves CMA,  March 30, 2010 1:56 PM  Additional Follow-up for Phone Call Additional follow up Details #1::        noted.  will await pharmacy response Additional Follow-up by: Neena Rhymes MD,  March 30, 2010 2:03 PM    New/Updated Medications: OMEPRAZOLE 40 MG CPDR (OMEPRAZOLE) as directed daily Prescriptions: OMEPRAZOLE 40 MG CPDR (OMEPRAZOLE) as directed daily  #30 x 3   Entered by:   Lucious Groves CMA   Authorized by:   Neena Rhymes MD   Signed by:   Lucious Groves CMA on 03/30/2010   Method used:   Electronically to        [facility] [id number] (retail)       [address]       [place], [place]  [address]       Ph: [phone]       Fax: [phone]   RxID:   [id number]

## 2010-04-20 ENCOUNTER — Encounter: Payer: Self-pay | Admitting: Family Medicine

## 2010-04-23 ENCOUNTER — Other Ambulatory Visit (HOSPITAL_COMMUNITY)
Admission: RE | Admit: 2010-04-23 | Discharge: 2010-04-23 | Disposition: A | Discharge status: Home / Self Care | Payer: BC Managed Care – PPO | Source: Ambulatory Visit | Attending: Family Medicine | Admitting: Family Medicine

## 2010-04-23 ENCOUNTER — Ambulatory Visit (INDEPENDENT_AMBULATORY_CARE_PROVIDER_SITE_OTHER): Payer: BC Managed Care – PPO | Admitting: Family Medicine

## 2010-04-23 ENCOUNTER — Encounter: Payer: Self-pay | Admitting: Family Medicine

## 2010-04-23 DIAGNOSIS — Z124 Encounter for screening for malignant neoplasm of cervix: Secondary | ICD-10-CM

## 2010-04-23 DIAGNOSIS — Z01419 Encounter for gynecological examination (general) (routine) without abnormal findings: Secondary | ICD-10-CM | POA: Insufficient documentation

## 2010-04-23 DIAGNOSIS — E119 Type 2 diabetes mellitus without complications: Secondary | ICD-10-CM

## 2010-04-23 DIAGNOSIS — E785 Hyperlipidemia, unspecified: Secondary | ICD-10-CM

## 2010-04-23 LAB — LIPID PANEL
Cholesterol: 150 mg/dL (ref 0–200)
LDL Cholesterol: 89 mg/dL (ref 0–99)
VLDL: 18.4 mg/dL (ref 0.0–40.0)

## 2010-04-23 LAB — HEPATIC FUNCTION PANEL
ALT: 15 U/L (ref 0–35)
AST: 22 U/L (ref 0–37)
Albumin: 3.8 g/dL (ref 3.5–5.2)
Alkaline Phosphatase: 57 U/L (ref 39–117)
Total Bilirubin: 1.1 mg/dL (ref 0.3–1.2)

## 2010-04-23 LAB — HEMOGLOBIN A1C: Hgb A1c MFr Bld: 6 % (ref 4.6–6.5)

## 2010-04-23 LAB — BASIC METABOLIC PANEL
Calcium: 8.9 mg/dL (ref 8.4–10.5)
GFR: 79.1 mL/min (ref >60.00)
Glucose, Bld: 106 mg/dL — ABNORMAL HIGH (ref 70–99)
Potassium: 4.7 mEq/L (ref 3.5–5.1)
Sodium: 135 mEq/L (ref 135–145)

## 2010-04-23 NOTE — Assessment & Plan Note (Signed)
Pap collected.  Exam normal.

## 2010-04-23 NOTE — Progress Notes (Signed)
  Subjective:    Patient ID: Lisa Patel, female    DOB: [birth year], 40 y.o.   MRN: [id number]  HPI DM- relatively new problem for pt, dx'd last year.  not currently on meds.  Has been controlling sugars w/ diet and exercise.  Exercising for 30 minutes Mon-Fri on the treadmill.  Has lost another 6 lbs.  Due for A1C.  No CP, SOB, HAs, visual changes, edema.  Hyperlipidemia- again, relatively new for pt, dx'd last year.  fasting today.  On Simvastatin- due for labs to assess control.  No abd pain, N/V, myalgias.  Pap- here today for pap.   Review of Systems For ROS see HPI     Objective:   Physical Exam  Constitutional: She appears well-developed and well-nourished. No distress.  HENT:  Head: Normocephalic and atraumatic.  Cardiovascular: Normal rate, regular rhythm, normal heart sounds and intact distal pulses.   No murmur heard. Pulmonary/Chest: Effort normal and breath sounds normal. No respiratory distress.  Abdominal: Soft. Bowel sounds are normal. She exhibits no distension. There is no tenderness.  Genitourinary: Vagina normal and uterus normal. Cervix exhibits friability. Cervix exhibits no motion tenderness and no discharge. Right adnexum displays no mass, no tenderness and no fullness. Left adnexum displays no mass, no tenderness and no fullness.  Musculoskeletal: She exhibits no edema.          Assessment & Plan:

## 2010-04-23 NOTE — Assessment & Plan Note (Signed)
Tolerating statin w/out difficulty.  Check labs and adjust meds prn. 

## 2010-04-23 NOTE — Assessment & Plan Note (Signed)
Pt continues to do well w/ diet and exercise.  Check labs to determine if meds are needed.  Applauded her efforts.  Will continue to follow closely.

## 2010-04-23 NOTE — Patient Instructions (Signed)
Please schedule a follow up in 3 months- you do not have to fast for this visit Keep up the good work on diet and exercise!  You're doing great! We'll notify you of your lab results and make any adjustments as needed Call with any questions or concerns Happy Spring!!!

## 2010-04-30 MED ORDER — FLUCONAZOLE 150 MG PO TABS: 150.0000 mg | ORAL_TABLET | Freq: Once | ORAL | Status: DC

## 2010-04-30 NOTE — Progress Notes (Signed)
Addended by: Alease Medina on: 04/30/2010 10:56 AM   Modules accepted: Orders

## 2011-01-09 ENCOUNTER — Encounter: Payer: Self-pay | Admitting: Family Medicine

## 2011-01-09 ENCOUNTER — Ambulatory Visit (INDEPENDENT_AMBULATORY_CARE_PROVIDER_SITE_OTHER): Payer: BC Managed Care – PPO | Admitting: Family Medicine

## 2011-01-09 ENCOUNTER — Encounter: Payer: Self-pay | Admitting: *Deleted

## 2011-01-09 DIAGNOSIS — F411 Generalized anxiety disorder: Secondary | ICD-10-CM

## 2011-01-09 DIAGNOSIS — E119 Type 2 diabetes mellitus without complications: Secondary | ICD-10-CM

## 2011-01-09 DIAGNOSIS — N760 Acute vaginitis: Secondary | ICD-10-CM | POA: Insufficient documentation

## 2011-01-09 DIAGNOSIS — E785 Hyperlipidemia, unspecified: Secondary | ICD-10-CM

## 2011-01-09 LAB — LIPID PANEL
HDL: 45.9 mg/dL (ref >39.00)
Triglycerides: 109 mg/dL (ref 0.0–149.0)

## 2011-01-09 LAB — BASIC METABOLIC PANEL
BUN: 26 mg/dL — ABNORMAL HIGH (ref 6–23)
Calcium: 9.5 mg/dL (ref 8.4–10.5)
GFR: 78.83 mL/min (ref >60.00)
Glucose, Bld: 124 mg/dL — ABNORMAL HIGH (ref 70–99)
Sodium: 139 mEq/L (ref 135–145)

## 2011-01-09 LAB — HEMOGLOBIN A1C: Hgb A1c MFr Bld: 5.8 % (ref 4.6–6.5)

## 2011-01-09 LAB — HEPATIC FUNCTION PANEL
Albumin: 3.9 g/dL (ref 3.5–5.2)
Total Protein: 7.2 g/dL (ref 6.0–8.3)

## 2011-01-09 MED ORDER — FLUCONAZOLE 150 MG PO TABS: 150.0000 mg | ORAL_TABLET | Freq: Once | ORAL | Status: AC

## 2011-01-09 MED ORDER — CITALOPRAM HYDROBROMIDE 20 MG PO TABS: 20.0000 mg | ORAL_TABLET | Freq: Every day | ORAL | Status: DC

## 2011-01-09 NOTE — Assessment & Plan Note (Signed)
Chronic problem.  Doing fairly well on celexa.  Discussed adding benzo prior to bed to assist w/ sleep issue but pt has to get up w/ her son nightly in hopes of avoiding bedwetting.  Will hold on benzo at this time.  Refill provided.  Will continue to follow.

## 2011-01-09 NOTE — Assessment & Plan Note (Signed)
Pt reports 5-6 weeks of thick, itchy discharge.  Start diflucan.

## 2011-01-09 NOTE — Assessment & Plan Note (Signed)
Chronic problem.  Pt is not currently taking statin- never got refill.  Check labs and determine starting point for med dosage.  Pt expressed understanding and is in agreement w/ plan.

## 2011-01-09 NOTE — Assessment & Plan Note (Signed)
Pt reports she continues to exercise and watch diet.  Overdue for labs.  Check labs and determine whether meds are required.  Pt expressed understanding and is in agreement w/ plan.

## 2011-01-09 NOTE — Progress Notes (Signed)
  Subjective:    Patient ID: Lisa Patel, female    DOB: 07/17/1970, 40 y.o.   MRN: [id number]  HPI DM- chronic problem.  Attempting to control w/ diet and exercise.  Not currently on meds.  Exercising 20 minutes 5x/week.  No CP, SOB, HAs, visual changes.  Overdue for labs.  Hyperlipidemia-  Chronic problem.  Never got refills of simvastatin.  'it's been awhile'.  Vaginitis- pt w/ hx of similar.  Reports itching for 5-6 weeks.  Has not used OTC meds.  + thick, clumpy vaginal d/c.  Anxiety- reports today was her last tab of Celexa.  Feels sxs are mostly well controlled.  Still having some anxiety prior to sleep.  'i can't get a good night sleep.  i only have horrible dreams'   Review of Systems For ROS see HPI     Objective:   Physical Exam  Vitals reviewed. Constitutional: She is oriented to person, place, and time. She appears well-developed and well-nourished. No distress.       obese  HENT:  Head: Normocephalic and atraumatic.  Eyes: Conjunctivae and EOM are normal. Pupils are equal, round, and reactive to light.  Neck: Normal range of motion. Neck supple. No thyromegaly present.  Cardiovascular: Normal rate, regular rhythm, normal heart sounds and intact distal pulses.   No murmur heard. Pulmonary/Chest: Effort normal and breath sounds normal. No respiratory distress.  Abdominal: Soft. She exhibits no distension. There is no tenderness.  Musculoskeletal: She exhibits no edema.  Lymphadenopathy:    She has no cervical adenopathy.  Neurological: She is alert and oriented to person, place, and time.  Skin: Skin is warm and dry.  Psychiatric: She has a normal mood and affect. Her behavior is normal.          Assessment & Plan:

## 2011-01-09 NOTE — Patient Instructions (Signed)
Schedule your complete physical for April We'll notify you of your lab results and make any changes if needed Call with any questions or concerns Hang in there!!! Happy New Year!!

## 2011-08-14 ENCOUNTER — Telehealth: Payer: Self-pay | Admitting: Family Medicine

## 2011-08-14 NOTE — Telephone Encounter (Signed)
Refill: Citalopram 20mg  tablets. Take 1 tablet by mouth every day. Qty 30. Last fill 07-14-11

## 2011-08-15 MED ORDER — CITALOPRAM HYDROBROMIDE 20 MG PO TABS: 20.0000 mg | ORAL_TABLET | Freq: Every day | ORAL | Status: DC

## 2011-08-15 NOTE — Telephone Encounter (Signed)
Refill done.  

## 2011-08-26 ENCOUNTER — Telehealth: Payer: Self-pay | Admitting: Family Medicine

## 2011-08-26 NOTE — Telephone Encounter (Signed)
Pt wants labs done prior to CPE 8.19.13 Can you put in orders please? Pt wants to come for labs 8.14.13 @ 830am for draw

## 2011-08-28 ENCOUNTER — Other Ambulatory Visit (INDEPENDENT_AMBULATORY_CARE_PROVIDER_SITE_OTHER): Payer: BC Managed Care – PPO

## 2011-08-28 DIAGNOSIS — Z0000 Encounter for general adult medical examination without abnormal findings: Secondary | ICD-10-CM

## 2011-08-28 LAB — LIPID PANEL
Cholesterol: 189 mg/dL (ref 0–200)
LDL Cholesterol: 117 mg/dL — ABNORMAL HIGH (ref 0–99)
Total CHOL/HDL Ratio: 4

## 2011-08-28 LAB — HEPATIC FUNCTION PANEL
ALT: 14 U/L (ref 0–35)
Alkaline Phosphatase: 58 U/L (ref 39–117)
Bilirubin, Direct: 0.1 mg/dL (ref 0.0–0.3)
Total Protein: 7.1 g/dL (ref 6.0–8.3)

## 2011-08-28 LAB — BASIC METABOLIC PANEL
CO2: 26 mEq/L (ref 19–32)
Chloride: 103 mEq/L (ref 96–112)
Creatinine, Ser: 0.8 mg/dL (ref 0.4–1.2)

## 2011-08-28 NOTE — Progress Notes (Signed)
Labs only

## 2011-08-29 ENCOUNTER — Encounter: Payer: Self-pay | Admitting: *Deleted

## 2011-08-29 MED ORDER — LEVOTHYROXINE SODIUM 75 MCG PO TABS: 75.0000 ug | ORAL_TABLET | Freq: Every day | ORAL | Status: DC

## 2011-08-29 NOTE — Telephone Encounter (Signed)
Labs were drawn

## 2011-08-29 NOTE — Addendum Note (Signed)
Addended by: Derry Lory A on: 08/29/2011 05:01 PM   Modules accepted: Orders

## 2011-09-11 ENCOUNTER — Encounter: Payer: Self-pay | Admitting: Family Medicine

## 2011-09-11 ENCOUNTER — Other Ambulatory Visit (HOSPITAL_COMMUNITY)
Admission: RE | Admit: 2011-09-11 | Discharge: 2011-09-11 | Disposition: A | Discharge status: Home / Self Care | Payer: BC Managed Care – PPO | Source: Ambulatory Visit | Attending: Family Medicine | Admitting: Family Medicine

## 2011-09-11 ENCOUNTER — Ambulatory Visit (INDEPENDENT_AMBULATORY_CARE_PROVIDER_SITE_OTHER): Payer: BC Managed Care – PPO | Admitting: Family Medicine

## 2011-09-11 VITALS — BP 125/78 | HR 83 | Temp 98.2°F | Ht 65.75 in | Wt 297.2 lb

## 2011-09-11 DIAGNOSIS — R209 Unspecified disturbances of skin sensation: Secondary | ICD-10-CM

## 2011-09-11 DIAGNOSIS — E039 Hypothyroidism, unspecified: Secondary | ICD-10-CM

## 2011-09-11 DIAGNOSIS — R200 Anesthesia of skin: Secondary | ICD-10-CM

## 2011-09-11 DIAGNOSIS — Z124 Encounter for screening for malignant neoplasm of cervix: Secondary | ICD-10-CM

## 2011-09-11 DIAGNOSIS — Z01419 Encounter for gynecological examination (general) (routine) without abnormal findings: Secondary | ICD-10-CM | POA: Insufficient documentation

## 2011-09-11 DIAGNOSIS — Z23 Encounter for immunization: Secondary | ICD-10-CM

## 2011-09-11 DIAGNOSIS — Z0000 Encounter for general adult medical examination without abnormal findings: Secondary | ICD-10-CM

## 2011-09-11 MED ORDER — CITALOPRAM HYDROBROMIDE 20 MG PO TABS: 20.0000 mg | ORAL_TABLET | Freq: Every day | ORAL | Status: DC

## 2011-09-11 NOTE — Patient Instructions (Addendum)
Follow up in 1 month to recheck thyroid We'll notify you of your pap results Someone will call you with your mammo appt and podiatry appt Try and get regular exercise and make healthy food choices Call with any questions or concerns Good luck this year!!!

## 2011-09-11 NOTE — Progress Notes (Signed)
  Subjective:    Patient ID: Lisa Patel, female    DOB: August 18, 1970, 41 y.o.   MRN: [id number]  HPI CPE- will get pap today, overdue on mammo ([facility]).  Hypothyroid- new dx for pt, starting Synthroid.  Feels that since she started this med she has been bloated, gained 6 lbs in 2 weeks.  L foot lateral numbness- first noted 3 weeks ago.  Noted around time that she felt something 'tear' on the underside of the foot.  Was having pain w/ 1st steps that improved w/ continued walking.  Now if pain free but numbness persists.   Review of Systems Patient reports no vision/ hearing changes, adenopathy,fever, weight change,  persistant/recurrent hoarseness , swallowing issues, chest pain, palpitations, persistant/recurrent cough, hemoptysis, dyspnea (rest/exertional/paroxysmal nocturnal), gastrointestinal bleeding (melena, rectal bleeding), abdominal pain, significant heartburn, bowel changes, GU symptoms (dysuria, hematuria, incontinence), Gyn symptoms (abnormal  bleeding, pain),  syncope, focal weakness, memory loss, skin/hair/nail changes, abnormal bruising or bleeding.     Objective:   Physical Exam  General Appearance:    Alert, cooperative, no distress, appears stated age- obese  Head:    Normocephalic, without obvious abnormality, atraumatic  Eyes:    PERRL, conjunctiva/corneas clear, EOM's intact, fundi    benign, both eyes  Ears:    Normal TM's and external ear canals, both ears  Nose:   Nares normal, septum midline, mucosa normal, no drainage    or sinus tenderness  Throat:   Lips, mucosa, and tongue normal; teeth and gums normal  Neck:   Supple, symmetrical, trachea midline, no adenopathy;    Thyroid: no enlargement/tenderness/nodules  Back:     Symmetric, no curvature, ROM normal, no CVA tenderness  Lungs:     Clear to auscultation bilaterally, respirations unlabored  Chest Wall:    No tenderness or deformity   Heart:    Regular rate and rhythm, S1 and S2 normal, no murmur, rub  or gallop  Breast Exam:    No tenderness, masses, or nipple abnormality  Abdomen:     Soft, non-tender, bowel sounds active all four quadrants,    no masses, no organomegaly  Genitalia:    External genitalia normal, cervix w/ nabothian cyst at 12 o'clock, no CMT, uterus in normal size and position, adnexa w/out mass or tenderness, mucosa pink and moist, no lesions or discharge present  Rectal:    Normal external appearance  Extremities:   Extremities normal, atraumatic, no cyanosis or edema  Pulses:   2+ and symmetric all extremities  Skin:   Skin color, texture, turgor normal, no rashes or lesions  Lymph nodes:   Cervical, supraclavicular, and axillary nodes normal  Neurologic:   CNII-XII intact, normal strength, sensation and reflexes    throughout          Assessment & Plan:

## 2011-09-19 ENCOUNTER — Encounter: Payer: Self-pay | Admitting: *Deleted

## 2011-09-20 ENCOUNTER — Encounter: Payer: Self-pay | Admitting: Family Medicine

## 2011-09-20 NOTE — Assessment & Plan Note (Signed)
New problem for pt.  Started on synthroid at last visit.  Recheck labs in 1 month as that will be 3 months from initial start date.  Encouraged pt to give this more time.  If continues to have side effects, will switch med.

## 2011-09-20 NOTE — Assessment & Plan Note (Signed)
New.  Normal sensation to monofilament test but pt reports persistent tingling after feeling/hearing something tear.  Will refer to podiatry for complete evaluation and tx.

## 2011-09-20 NOTE — Assessment & Plan Note (Signed)
Pap collected. 

## 2011-09-20 NOTE — Assessment & Plan Note (Signed)
Pt's PE WNL.  Due for mammo.  Reviewed recent labs.  Anticipatory guidance provided.

## 2011-10-08 ENCOUNTER — Other Ambulatory Visit: Payer: Self-pay | Admitting: *Deleted

## 2011-10-08 MED ORDER — CITALOPRAM HYDROBROMIDE 20 MG PO TABS: 20.0000 mg | ORAL_TABLET | Freq: Every day | ORAL | Status: DC

## 2011-10-08 NOTE — Telephone Encounter (Signed)
Pt left vm requesting a refill of her anxiety medication to be sent to [facility] on [address], called pt to verify medication as celexa, noted pt upcoming follow up apt for 10-13, left vm on home and mobile number to clarify

## 2011-10-08 NOTE — Telephone Encounter (Signed)
Pt called in to verify pharmacy and celexa is the medication refill she needs, sent via escribe

## 2011-10-16 ENCOUNTER — Ambulatory Visit (INDEPENDENT_AMBULATORY_CARE_PROVIDER_SITE_OTHER): Payer: BC Managed Care – PPO | Admitting: Family Medicine

## 2011-10-16 ENCOUNTER — Encounter: Payer: Self-pay | Admitting: Family Medicine

## 2011-10-16 VITALS — BP 130/74 | HR 88 | Temp 98.1°F | Ht 65.75 in | Wt 297.8 lb

## 2011-10-16 DIAGNOSIS — E039 Hypothyroidism, unspecified: Secondary | ICD-10-CM

## 2011-10-16 NOTE — Assessment & Plan Note (Signed)
Due for lab recheck.  Discussed continued use of thyroid meds.  Unlikely that medication is causing swelling or bloating intermittently- if it were a true med effect it should be daily.  Will continue to follow and adjust meds prn.  Pt expressed understanding and is in agreement w/ plan.

## 2011-10-16 NOTE — Progress Notes (Signed)
  Subjective:    Patient ID: Lisa Patel, female    DOB: Jan 05, 1971, 41 y.o.   MRN: [id number]  HPI Hypothyroid- recent dx.  Started on meds in August.  Due for labs.  Pt unable to tell if fatigue improved w/ meds b/c she started them so close to going back to school.  Still having 'bloating a couple of times a week'.  + ankle edema intermittently.  Weight gain has stabilized.     Review of Systems For ROS see HPI     Objective:   Physical Exam  Constitutional: She is oriented to person, place, and time. She appears well-developed and well-nourished. No distress.       obese  HENT:  Head: Normocephalic and atraumatic.  Eyes: Conjunctivae normal and EOM are normal. Pupils are equal, round, and reactive to light.  Neck: Normal range of motion. Neck supple. No thyromegaly present.  Cardiovascular: Normal rate, regular rhythm, normal heart sounds and intact distal pulses.   No murmur heard. Pulmonary/Chest: Effort normal and breath sounds normal. No respiratory distress.  Abdominal: Soft. She exhibits no distension. There is no tenderness.  Musculoskeletal: She exhibits no edema.  Lymphadenopathy:    She has no cervical adenopathy.  Neurological: She is alert and oriented to person, place, and time.  Skin: Skin is warm and dry.  Psychiatric: She has a normal mood and affect. Her behavior is normal.          Assessment & Plan:

## 2011-10-16 NOTE — Patient Instructions (Addendum)
Follow up in Feb to recheck diabetes, cholesterol We'll notify you of your lab results and adjust the med as needed Call with any questions or concerns- particularly if the swelling doesn't improve Happy Fall!!!

## 2011-10-17 ENCOUNTER — Encounter: Payer: Self-pay | Admitting: *Deleted

## 2011-11-11 ENCOUNTER — Encounter: Payer: Self-pay | Admitting: Family Medicine

## 2011-11-18 ENCOUNTER — Telehealth: Payer: Self-pay | Admitting: Family Medicine

## 2011-11-18 NOTE — Telephone Encounter (Signed)
In reference to Podiatry referral entered on 09/11/11, after multiple attempts and message left by [facility], and myself, for patient to return our call, there has been no response.  I also mailed a letter to patient, and she is not responding.

## 2011-11-18 NOTE — Telephone Encounter (Signed)
Noted! Thank you

## 2011-12-06 ENCOUNTER — Encounter: Payer: Self-pay | Admitting: Family Medicine

## 2011-12-16 ENCOUNTER — Telehealth: Payer: Self-pay | Admitting: Family Medicine

## 2011-12-16 MED ORDER — LEVOTHYROXINE SODIUM 75 MCG PO TABS: 75.0000 ug | ORAL_TABLET | Freq: Every day | ORAL | Status: DC

## 2011-12-16 NOTE — Telephone Encounter (Signed)
Refill: Levothyroxine 0.075 mg tabs. Take 1 tablet by mouth daily. Qty 30. **Request 90 day supply**

## 2011-12-16 NOTE — Telephone Encounter (Signed)
Rx sent 

## 2012-03-13 ENCOUNTER — Telehealth: Payer: Self-pay | Admitting: Family Medicine

## 2012-03-13 NOTE — Telephone Encounter (Signed)
refill Citalopram (Tab) 20 MG Take 1 tablet (20 mg total) by mouth daily #30 last fill 12.1.13

## 2012-03-13 NOTE — Telephone Encounter (Signed)
Please advise on RF request.  Last CPE:09-11-11.//AB/CMA

## 2012-03-15 NOTE — Telephone Encounter (Signed)
Ok for #30, 6 refills 

## 2012-03-17 MED ORDER — CITALOPRAM HYDROBROMIDE 20 MG PO TABS: 20.0000 mg | ORAL_TABLET | Freq: Every day | ORAL | Status: DC

## 2012-03-17 NOTE — Telephone Encounter (Signed)
Rx sent to the pharmacy([facility]) by e-script.//AB/CMA

## 2012-06-16 ENCOUNTER — Other Ambulatory Visit: Payer: Self-pay | Admitting: Family Medicine

## 2012-06-17 ENCOUNTER — Other Ambulatory Visit: Payer: Self-pay | Admitting: Family Medicine

## 2012-06-17 NOTE — Telephone Encounter (Signed)
Med filled.  

## 2012-07-22 LAB — HM DIABETES EYE EXAM

## 2012-10-26 ENCOUNTER — Other Ambulatory Visit: Payer: Self-pay | Admitting: Family Medicine

## 2012-10-26 NOTE — Telephone Encounter (Signed)
Med filled, letter mailed to pt to schedule an OV have not seen since 2013.

## 2012-10-27 NOTE — Telephone Encounter (Signed)
Pt needs a physical to receive more than #30 of celexa.

## 2012-11-25 ENCOUNTER — Other Ambulatory Visit: Payer: Self-pay | Admitting: Family Medicine

## 2012-11-26 NOTE — Telephone Encounter (Signed)
Med filled.  

## 2012-12-28 ENCOUNTER — Other Ambulatory Visit: Payer: Self-pay | Admitting: Family Medicine

## 2012-12-29 NOTE — Telephone Encounter (Signed)
Med filled.  

## 2013-01-04 ENCOUNTER — Telehealth: Payer: Self-pay

## 2013-01-04 NOTE — Telephone Encounter (Addendum)
Left message for call back  identifiable  Medication and allergies:  Reviewed and updated  90 day supply/mail order: na Local pharmacy: [facility] [address]   Immunizations due:  UTD  A/P:   No changes to FH or PSH or personal hx Pap--08/2011--nml MMG--11/2011--neg Tdap--08/2011 Flu vaccine at work Eye exam--07/2012--Dr Perez--no retinopathy  To Discuss with Provider: Has only been taking synthroid every other day Panic attacks--taking celexa

## 2013-01-05 ENCOUNTER — Ambulatory Visit (INDEPENDENT_AMBULATORY_CARE_PROVIDER_SITE_OTHER): Payer: BC Managed Care – PPO | Admitting: Family Medicine

## 2013-01-05 ENCOUNTER — Encounter: Payer: Self-pay | Admitting: Family Medicine

## 2013-01-05 VITALS — BP 128/88 | HR 79 | Temp 98.1°F | Resp 16 | Ht 65.5 in | Wt 288.0 lb

## 2013-01-05 DIAGNOSIS — Z0000 Encounter for general adult medical examination without abnormal findings: Secondary | ICD-10-CM

## 2013-01-05 DIAGNOSIS — Z01419 Encounter for gynecological examination (general) (routine) without abnormal findings: Secondary | ICD-10-CM

## 2013-01-05 DIAGNOSIS — E119 Type 2 diabetes mellitus without complications: Secondary | ICD-10-CM

## 2013-01-05 LAB — CBC WITH DIFFERENTIAL/PLATELET
Eosinophils Relative: 3.1 % (ref 0.0–5.0)
HCT: 39.1 % (ref 36.0–46.0)
Lymphocytes Relative: 22.2 % (ref 12.0–46.0)
Lymphs Abs: 1.5 10*3/uL (ref 0.7–4.0)
Monocytes Relative: 4.8 % (ref 3.0–12.0)
Platelets: 212 10*3/uL (ref 150.0–400.0)
WBC: 6.6 10*3/uL (ref 4.5–10.5)

## 2013-01-05 LAB — LIPID PANEL
HDL: 44.7 mg/dL (ref >39.00)
LDL Cholesterol: 114 mg/dL — ABNORMAL HIGH (ref 0–99)
Total CHOL/HDL Ratio: 4
Triglycerides: 124 mg/dL (ref 0.0–149.0)

## 2013-01-05 LAB — HEPATIC FUNCTION PANEL
ALT: 18 U/L (ref 0–35)
Albumin: 3.9 g/dL (ref 3.5–5.2)
Alkaline Phosphatase: 51 U/L (ref 39–117)
Bilirubin, Direct: 0.1 mg/dL (ref 0.0–0.3)
Total Bilirubin: 0.7 mg/dL (ref 0.3–1.2)
Total Protein: 7 g/dL (ref 6.0–8.3)

## 2013-01-05 LAB — BASIC METABOLIC PANEL
CO2: 27 mEq/L (ref 19–32)
Chloride: 104 mEq/L (ref 96–112)
Glucose, Bld: 115 mg/dL — ABNORMAL HIGH (ref 70–99)
Potassium: 4.3 mEq/L (ref 3.5–5.1)
Sodium: 137 mEq/L (ref 135–145)

## 2013-01-05 LAB — TSH: TSH: 1.87 u[IU]/mL (ref 0.35–5.50)

## 2013-01-05 NOTE — Progress Notes (Signed)
   Subjective:    Patient ID: Lisa Patel, female    DOB: 1970/05/02, 42 y.o.   MRN: [id number]  HPI CPE- UTD on pap, due for mammo ([facility])   Review of Systems Patient reports no vision/ hearing changes, adenopathy,fever, weight change,  persistant/recurrent hoarseness , swallowing issues, chest pain, palpitations, edema, persistant/recurrent cough, hemoptysis, dyspnea (rest/exertional/paroxysmal nocturnal), gastrointestinal bleeding (melena, rectal bleeding), abdominal pain, significant heartburn, bowel changes, GU symptoms (dysuria, hematuria, incontinence), Gyn symptoms (abnormal  bleeding, pain),  syncope, focal weakness, memory loss, numbness & tingling, skin/hair/nail changes, abnormal bruising or bleeding, anxiety, or depression.  + water retention and bloating     Objective:   Physical Exam  General Appearance:    Alert, cooperative, no distress, appears stated age  Head:    Normocephalic, without obvious abnormality, atraumatic  Eyes:    PERRL, conjunctiva/corneas clear, EOM's intact, fundi    benign, both eyes  Ears:    Normal TM's and external ear canals, both ears  Nose:   Nares normal, septum midline, mucosa normal, no drainage    or sinus tenderness  Throat:   Lips, mucosa, and tongue normal; teeth and gums normal  Neck:   Supple, symmetrical, trachea midline, no adenopathy;    Thyroid: no enlargement/tenderness/nodules  Back:     Symmetric, no curvature, ROM normal, no CVA tenderness  Lungs:     Clear to auscultation bilaterally, respirations unlabored  Chest Wall:    No tenderness or deformity   Heart:    Regular rate and rhythm, S1 and S2 normal, no murmur, rub   or gallop  Breast Exam:    No tenderness, masses, or nipple abnormality  Abdomen:     Soft, non-tender, bowel sounds active all four quadrants,    no masses, no organomegaly  Genitalia:    Deferred  Rectal:    Extremities:   Extremities normal, atraumatic, no cyanosis or edema  Pulses:   2+ and  symmetric all extremities  Skin:   Skin color, texture, turgor normal, no rashes or lesions  Lymph nodes:   Cervical, supraclavicular, and axillary nodes normal  Neurologic:   CNII-XII intact, normal strength, sensation and reflexes    throughout          Assessment & Plan:

## 2013-01-05 NOTE — Patient Instructions (Signed)
Follow up in 6 months to recheck BP, cholesterol, sugar We'll notify you of your lab results and make any changes if needed Keep up the good work on healthy diet and regular exercise Call with any questions or concerns Altamese Cabal Christmas!!!

## 2013-01-05 NOTE — Progress Notes (Signed)
Pre visit review using our clinic review tool, if applicable. No additional management support is needed unless otherwise documented below in the visit note. 

## 2013-01-05 NOTE — Assessment & Plan Note (Signed)
Check A1C.  Adjust plan prn.

## 2013-01-05 NOTE — Assessment & Plan Note (Signed)
Pt's PE WNL.  Applauded recent weight loss.  Pt to schedule mammo w/ [facility].  Check labs.  Anticipatory guidance provided.

## 2013-01-08 ENCOUNTER — Encounter: Payer: Self-pay | Admitting: General Practice

## 2013-01-08 LAB — HEMOGLOBIN A1C: Hgb A1c MFr Bld: 5.9 % (ref 4.6–6.5)

## 2013-01-08 LAB — VITAMIN D 1,25 DIHYDROXY
Vitamin D2 1, 25 (OH)2: <8 pg/mL
Vitamin D3 1, 25 (OH)2: 77 pg/mL

## 2013-01-11 ENCOUNTER — Encounter: Payer: Self-pay | Admitting: General Practice

## 2013-01-31 ENCOUNTER — Other Ambulatory Visit: Payer: Self-pay | Admitting: Family Medicine

## 2013-06-29 ENCOUNTER — Other Ambulatory Visit: Payer: Self-pay | Admitting: Family Medicine

## 2013-06-30 NOTE — Telephone Encounter (Signed)
Med filled and letter mailed to pt to schedule appt.

## 2013-08-19 ENCOUNTER — Ambulatory Visit (INDEPENDENT_AMBULATORY_CARE_PROVIDER_SITE_OTHER): Payer: BC Managed Care – PPO | Admitting: Family Medicine

## 2013-08-19 ENCOUNTER — Encounter: Payer: Self-pay | Admitting: Family Medicine

## 2013-08-19 ENCOUNTER — Other Ambulatory Visit: Payer: Self-pay | Admitting: Family Medicine

## 2013-08-19 VITALS — BP 124/82 | HR 86 | Temp 98.0°F | Resp 16 | Wt 298.0 lb

## 2013-08-19 DIAGNOSIS — E119 Type 2 diabetes mellitus without complications: Secondary | ICD-10-CM

## 2013-08-19 DIAGNOSIS — F411 Generalized anxiety disorder: Secondary | ICD-10-CM

## 2013-08-19 DIAGNOSIS — E039 Hypothyroidism, unspecified: Secondary | ICD-10-CM

## 2013-08-19 DIAGNOSIS — E785 Hyperlipidemia, unspecified: Secondary | ICD-10-CM

## 2013-08-19 LAB — BASIC METABOLIC PANEL
BUN: 16 mg/dL (ref 6–23)
CALCIUM: 9.4 mg/dL (ref 8.4–10.5)
CO2: 26 meq/L (ref 19–32)
Chloride: 101 mEq/L (ref 96–112)
Creatinine, Ser: 0.8 mg/dL (ref 0.4–1.2)
GFR: 81.22 mL/min (ref >60.00)
GLUCOSE: 106 mg/dL — AB (ref 70–99)
Potassium: 4.6 mEq/L (ref 3.5–5.1)
SODIUM: 135 meq/L (ref 135–145)

## 2013-08-19 LAB — LIPID PANEL
CHOLESTEROL: 183 mg/dL (ref 0–200)
HDL: 42.7 mg/dL (ref >39.00)
LDL Cholesterol: 119 mg/dL — ABNORMAL HIGH (ref 0–99)
NonHDL: 140.3
Total CHOL/HDL Ratio: 4
Triglycerides: 109 mg/dL (ref 0.0–149.0)
VLDL: 21.8 mg/dL (ref 0.0–40.0)

## 2013-08-19 LAB — HEPATIC FUNCTION PANEL
ALBUMIN: 3.8 g/dL (ref 3.5–5.2)
ALT: 15 U/L (ref 0–35)
AST: 20 U/L (ref 0–37)
Alkaline Phosphatase: 72 U/L (ref 39–117)
BILIRUBIN DIRECT: 0.1 mg/dL (ref 0.0–0.3)
TOTAL PROTEIN: 7.3 g/dL (ref 6.0–8.3)
Total Bilirubin: 0.9 mg/dL (ref 0.2–1.2)

## 2013-08-19 LAB — MICROALBUMIN / CREATININE URINE RATIO
Creatinine,U: 289.3 mg/dL
MICROALB UR: 1.2 mg/dL (ref 0.0–1.9)
Microalb Creat Ratio: 0.4 mg/g (ref 0.0–30.0)

## 2013-08-19 LAB — TSH: TSH: 0.9 u[IU]/mL (ref 0.35–4.50)

## 2013-08-19 LAB — HEMOGLOBIN A1C: HEMOGLOBIN A1C: 6.2 % (ref 4.6–6.5)

## 2013-08-19 MED ORDER — CITALOPRAM HYDROBROMIDE 20 MG PO TABS: ORAL_TABLET | ORAL | Status: DC

## 2013-08-19 NOTE — Assessment & Plan Note (Signed)
Refill provided on Celexa

## 2013-08-19 NOTE — Progress Notes (Signed)
Pre visit review using our clinic review tool, if applicable. No additional management support is needed unless otherwise documented below in the visit note. 

## 2013-08-19 NOTE — Assessment & Plan Note (Signed)
Chronic problem.  Pt is symptomatic.  Check labs.  Adjust meds prn.

## 2013-08-19 NOTE — Assessment & Plan Note (Signed)
Chronic problem.  Pt stopped statin w/o instruction to do so.  Check labs.  Restart meds prn.

## 2013-08-19 NOTE — Telephone Encounter (Signed)
Med filled.  

## 2013-08-19 NOTE — Progress Notes (Signed)
   Subjective:    Patient ID: Lisa Patel, female    DOB: [birth year], 43 y.o.   MRN: [id number]  HPI DM- ongoing issue for pt, has never been on meds.  Has gained 10 lbs.  Exercising 3x/week on treadmill.  Not checking sugars.  Due for eye exam.  No CP, SOB, HAs, visual changes, edema.  Has had 3 episodes of dizziness.  Hyperlipidemia- chronic problem, stopped Simvastatin 'a long time ago'.    Hypothyroid- chronic problem, on Synthroid.  + fatigue.  + constipation.  No changes to hair/skin/nails.  Has gained weight.   Review of Systems For ROS see HPI     Objective:   Physical Exam  Vitals reviewed. Constitutional: She is oriented to person, place, and time. She appears well-developed and well-nourished. No distress.  obese  HENT:  Head: Normocephalic and atraumatic.  Eyes: Conjunctivae and EOM are normal. Pupils are equal, round, and reactive to light.  Neck: Normal range of motion. Neck supple. No thyromegaly present.  Cardiovascular: Normal rate, regular rhythm, normal heart sounds and intact distal pulses.   No murmur heard. Pulmonary/Chest: Effort normal and breath sounds normal. No respiratory distress.  Abdominal: Soft. She exhibits no distension. There is no tenderness.  Musculoskeletal: She exhibits no edema.  Lymphadenopathy:    She has no cervical adenopathy.  Neurological: She is alert and oriented to person, place, and time.  Skin: Skin is warm and dry.  Psychiatric: She has a normal mood and affect. Her behavior is normal.          Assessment & Plan:

## 2013-08-19 NOTE — Assessment & Plan Note (Signed)
Chronic problem.  Pt has been attempting to control w/ diet and exercise- has never been on meds.  Unfortunately pt has gained another 10 lbs.  Encouraged pt to schedule eye exam.  Check labs.  Encouraged healthy diet and regular exercise.  Start meds prn.

## 2013-08-19 NOTE — Patient Instructions (Signed)
Schedule your complete physical in December We'll notify you of your lab results and make any changes if needed Drink plenty of fluids Change positions slowly to allow yourself time to adjust I love that you are exercising!  Keep up the good work! Call with any questions or concerns Enjoy the rest of your summer!

## 2013-08-20 ENCOUNTER — Encounter: Payer: Self-pay | Admitting: General Practice

## 2013-12-28 ENCOUNTER — Encounter: Payer: Self-pay | Admitting: Physician Assistant

## 2013-12-28 ENCOUNTER — Ambulatory Visit (HOSPITAL_BASED_OUTPATIENT_CLINIC_OR_DEPARTMENT_OTHER)
Admission: RE | Admit: 2013-12-28 | Discharge: 2013-12-28 | Disposition: A | Discharge status: Home / Self Care | Payer: BC Managed Care – PPO | Source: Ambulatory Visit | Attending: Physician Assistant | Admitting: Physician Assistant

## 2013-12-28 ENCOUNTER — Ambulatory Visit (INDEPENDENT_AMBULATORY_CARE_PROVIDER_SITE_OTHER): Payer: BC Managed Care – PPO | Admitting: Physician Assistant

## 2013-12-28 VITALS — BP 133/76 | HR 82 | Temp 98.1°F | Resp 12 | Ht 65.0 in | Wt 302.2 lb

## 2013-12-28 DIAGNOSIS — S8992XA Unspecified injury of left lower leg, initial encounter: Secondary | ICD-10-CM

## 2013-12-28 DIAGNOSIS — M25562 Pain in left knee: Secondary | ICD-10-CM | POA: Diagnosis not present

## 2013-12-28 DIAGNOSIS — S8990XA Unspecified injury of unspecified lower leg, initial encounter: Secondary | ICD-10-CM | POA: Insufficient documentation

## 2013-12-28 NOTE — Progress Notes (Signed)
Patient presents to clinic today c/o pain in left knee and ankle after falling directly onto her knee 2 weeks ago. Endorses original swelling of knee after injury that has resolved.  Pain has persisted and is present at rest, with weight bearing and with ROM.  Also has noted some mild swelling of her left ankle over the past couple of days.  Denies pain, decreased ROM or numbness/tingling. Has not taken anything for her symptoms.  Past Medical History  Diagnosis Date  . GERD (gastroesophageal reflux disease)   . Vitamin D deficiency   . Obesity   . Diabetes mellitus   . Hyperlipidemia   . Thyroid disease   . Panic attack     Current Outpatient Prescriptions on File Prior to Visit  Medication Sig Dispense Refill  . citalopram (CELEXA) 20 MG tablet TAKE 1 TABLET BY MOUTH DAILY 90 tablet 1  . levothyroxine (SYNTHROID, LEVOTHROID) 75 MCG tablet pt takes, M, W, and F.    . loratadine-pseudoephedrine (CLARITIN-D 24 HOUR) 10-240 MG per 24 hr tablet Take 1 tablet by mouth daily.      . Multiple Vitamin (MULTIVITAMIN) tablet Take 1 tablet by mouth daily.    Marland Kitchen omeprazole (PRILOSEC) 20 MG capsule Take 20 mg by mouth daily.    . simvastatin (ZOCOR) 40 MG tablet Take 40 mg by mouth at bedtime.       No current facility-administered medications on file prior to visit.    No Known Allergies  Family History  Problem Relation Age of Onset  . Diabetes Maternal Grandmother   . Diabetes Maternal Grandfather   . Diabetes Paternal Grandmother   . Diabetes Paternal Grandfather     History   Social History  . Marital Status: Married    Spouse Name: N/A    Number of Children: N/A  . Years of Education: N/A   Social History Main Topics  . Smoking status: Never Smoker   . Smokeless tobacco: Never Used  . Alcohol Use: None  . Drug Use: None  . Sexual Activity: None   Other Topics Concern  . None   Social History Narrative   Review of Systems - See HPI.  All other ROS are  negative.  BP 133/76 mmHg  Pulse 82  Temp(Src) 98.1 F (36.7 C)  Resp 12  Ht 5\' 5"  (1.651 m)  Wt 302 lb 3.2 oz (137.077 kg)  BMI 50.29 kg/m2  LMP 12/24/2013  Physical Exam  Constitutional: She is oriented to person, place, and time and well-developed, well-nourished, and in no distress.  HENT:  Head: Normocephalic and atraumatic.  Eyes: Conjunctivae are normal.  Neck: Neck supple.  Cardiovascular: Normal rate, regular rhythm, normal heart sounds and intact distal pulses.   Pulmonary/Chest: Effort normal and breath sounds normal. No respiratory distress. She has no wheezes. She has no rales. She exhibits no tenderness.  Musculoskeletal:       Left knee: She exhibits abnormal patellar mobility. She exhibits normal range of motion and no swelling. Tenderness found. Medial joint line and patellar tendon tenderness noted.       Left ankle: She exhibits normal range of motion and no swelling. No tenderness.  Neurological: She is alert and oriented to person, place, and time.  Skin: Skin is warm and dry. No rash noted.  Psychiatric: Affect normal.  Vitals reviewed.  Assessment/Plan: Knee injury Some mild abnormal patellar mobility.  Will obtain x-ray to further assess.  RICE therapy, compression sleeve and alternating between Tylenol and  Ibuprofen discussed with patient.  If x-ray negative and symptoms not improving over the next week, will refer to Sports Medicine for further assessment.  If x-ray reveals fracture or gross abnormality will send straight to Orthopedic Surgery.

## 2013-12-28 NOTE — Assessment & Plan Note (Signed)
Some mild abnormal patellar mobility.  Will obtain x-ray to further assess.  RICE therapy, compression sleeve and alternating between Tylenol and Ibuprofen discussed with patient.  If x-ray negative and symptoms not improving over the next week, will refer to Sports Medicine for further assessment.  If x-ray reveals fracture or gross abnormality will send straight to Orthopedic Surgery.

## 2013-12-28 NOTE — Patient Instructions (Addendum)
Please go downstairs for imaging.  I will call you with your results. Wear the knee sling daily to add compression.  Alternate between Ibuprofen and extra strength tylenol for pain.  Apply topical aspercreme to the area.  Keep the leg elevated while resting at home. If anything is broken, we will be sending you directly to Orthopedics for treatment.  RICE: Routine Care for Injuries The routine care of many injuries includes Rest, Ice, Compression, and Elevation (RICE). HOME CARE INSTRUCTIONS  Rest is needed to allow your body to heal. Routine activities can usually be resumed when comfortable. Injured tendons and bones can take up to 6 weeks to heal. Tendons are the cord-like structures that attach muscle to bone.  Ice following an injury helps keep the swelling down and reduces pain.  Put ice in a plastic bag.  Place a towel between your skin and the bag.  Leave the ice on for 15-20 minutes, 3-4 times a day, or as directed by your health care provider. Do this while awake, for the first 24 to 48 hours. After that, continue as directed by your caregiver.  Compression helps keep swelling down. It also gives support and helps with discomfort. If an elastic bandage has been applied, it should be removed and reapplied every 3 to 4 hours. It should not be applied tightly, but firmly enough to keep swelling down. Watch fingers or toes for swelling, bluish discoloration, coldness, numbness, or excessive pain. If any of these problems occur, remove the bandage and reapply loosely. Contact your caregiver if these problems continue.  Elevation helps reduce swelling and decreases pain. With extremities, such as the arms, hands, legs, and feet, the injured area should be placed near or above the level of the heart, if possible. SEEK IMMEDIATE MEDICAL CARE IF:  You have persistent pain and swelling.  You develop redness, numbness, or unexpected weakness.  Your symptoms are getting worse rather than  improving after several days. These symptoms may indicate that further evaluation or further X-rays are needed. Sometimes, X-rays may not show a small broken bone (fracture) until 1 week or 10 days later. Make a follow-up appointment with your caregiver. Ask when your X-ray results will be ready. Make sure you get your X-ray results. Document Released: 04/14/2000 Document Revised: 01/05/2013 Document Reviewed: 06/01/2010 [facility] Patient Information 2015 [place], [place]. This information is not intended to replace advice given to you by your health care provider. Make sure you discuss any questions you have with your health care provider.

## 2013-12-29 ENCOUNTER — Encounter: Payer: Self-pay | Admitting: Family Medicine

## 2013-12-29 DIAGNOSIS — M25562 Pain in left knee: Secondary | ICD-10-CM

## 2013-12-30 ENCOUNTER — Encounter: Payer: Self-pay | Admitting: Physician Assistant

## 2013-12-30 DIAGNOSIS — M7989 Other specified soft tissue disorders: Secondary | ICD-10-CM

## 2013-12-30 NOTE — Telephone Encounter (Signed)
Lisa Patel.  I can set you up with an ultrasound to rule out a blood clot at the [place]. However because there was not significant calf swelling or pain on examination, I do no have much suspicion there is a clot there. I have placed and order for the [place].  You will be contacted by our office to schedule. I will also call you with the [place] result once it has been obtained. Feel free to cancel the referral with Dr. Barbaraann Barthel.

## 2013-12-31 ENCOUNTER — Ambulatory Visit (HOSPITAL_BASED_OUTPATIENT_CLINIC_OR_DEPARTMENT_OTHER)
Admission: RE | Admit: 2013-12-31 | Discharge: 2013-12-31 | Disposition: A | Discharge status: Home / Self Care | Payer: BC Managed Care – PPO | Source: Ambulatory Visit | Attending: Physician Assistant | Admitting: Physician Assistant

## 2013-12-31 DIAGNOSIS — M7989 Other specified soft tissue disorders: Secondary | ICD-10-CM

## 2013-12-31 DIAGNOSIS — M25562 Pain in left knee: Secondary | ICD-10-CM | POA: Diagnosis present

## 2014-01-03 ENCOUNTER — Ambulatory Visit: Payer: BC Managed Care – PPO | Admitting: Family Medicine

## 2014-01-04 ENCOUNTER — Telehealth: Payer: Self-pay | Admitting: Family Medicine

## 2014-01-04 NOTE — Telephone Encounter (Signed)
Caller name:Schwake, [name] Relation to TI:RWER Call back number:[phone] Pharmacy:  Reason for call: pt states she is returning your call, please call back

## 2014-01-04 NOTE — Telephone Encounter (Signed)
Notified pt and she voices understanding. 

## 2014-01-04 NOTE — Telephone Encounter (Signed)
See u/s result note. 

## 2014-01-05 NOTE — Telephone Encounter (Signed)
Error/gd °

## 2014-01-10 ENCOUNTER — Encounter: Payer: BC Managed Care – PPO | Admitting: Family Medicine

## 2014-01-28 ENCOUNTER — Other Ambulatory Visit: Payer: Self-pay | Admitting: Family Medicine

## 2014-01-28 NOTE — Telephone Encounter (Signed)
Med filled.  

## 2014-07-19 ENCOUNTER — Telehealth: Payer: Self-pay | Admitting: *Deleted

## 2014-07-19 NOTE — Telephone Encounter (Signed)
Unable to reach patient at time of Pre-Visit Call.  Left message for patient to return call when available.    

## 2014-07-20 ENCOUNTER — Other Ambulatory Visit (HOSPITAL_COMMUNITY)
Admission: RE | Admit: 2014-07-20 | Discharge: 2014-07-20 | Disposition: A | Discharge status: Home / Self Care | Payer: BC Managed Care – PPO | Source: Ambulatory Visit | Attending: Family Medicine | Admitting: Family Medicine

## 2014-07-20 ENCOUNTER — Encounter: Payer: Self-pay | Admitting: Family Medicine

## 2014-07-20 ENCOUNTER — Ambulatory Visit (INDEPENDENT_AMBULATORY_CARE_PROVIDER_SITE_OTHER): Payer: BC Managed Care – PPO | Admitting: Family Medicine

## 2014-07-20 VITALS — BP 132/80 | HR 87 | Temp 98.2°F | Resp 16 | Ht 65.0 in | Wt 302.2 lb

## 2014-07-20 DIAGNOSIS — Z1151 Encounter for screening for human papillomavirus (HPV): Secondary | ICD-10-CM | POA: Insufficient documentation

## 2014-07-20 DIAGNOSIS — Z124 Encounter for screening for malignant neoplasm of cervix: Secondary | ICD-10-CM

## 2014-07-20 DIAGNOSIS — Z1231 Encounter for screening mammogram for malignant neoplasm of breast: Secondary | ICD-10-CM | POA: Diagnosis not present

## 2014-07-20 DIAGNOSIS — E119 Type 2 diabetes mellitus without complications: Secondary | ICD-10-CM

## 2014-07-20 DIAGNOSIS — Z01419 Encounter for gynecological examination (general) (routine) without abnormal findings: Secondary | ICD-10-CM | POA: Insufficient documentation

## 2014-07-20 LAB — HEPATIC FUNCTION PANEL
ALK PHOS: 74 U/L (ref 39–117)
ALT: 24 U/L (ref 0–35)
AST: 33 U/L (ref 0–37)
Albumin: 3.9 g/dL (ref 3.5–5.2)
Bilirubin, Direct: 0.1 mg/dL (ref 0.0–0.3)
TOTAL PROTEIN: 7.3 g/dL (ref 6.0–8.3)
Total Bilirubin: 0.5 mg/dL (ref 0.2–1.2)

## 2014-07-20 LAB — HEMOGLOBIN A1C: Hgb A1c MFr Bld: 6 % (ref 4.6–6.5)

## 2014-07-20 LAB — BASIC METABOLIC PANEL
BUN: 19 mg/dL (ref 6–23)
CALCIUM: 9.4 mg/dL (ref 8.4–10.5)
CHLORIDE: 102 meq/L (ref 96–112)
CO2: 27 mEq/L (ref 19–32)
CREATININE: 0.83 mg/dL (ref 0.40–1.20)
GFR: 78.64 mL/min (ref >60.00)
Glucose, Bld: 110 mg/dL — ABNORMAL HIGH (ref 70–99)
POTASSIUM: 4.4 meq/L (ref 3.5–5.1)
Sodium: 136 mEq/L (ref 135–145)

## 2014-07-20 LAB — MICROALBUMIN / CREATININE URINE RATIO
Creatinine,U: 255.1 mg/dL
Microalb Creat Ratio: 1.1 mg/g (ref 0.0–30.0)
Microalb, Ur: 2.8 mg/dL — ABNORMAL HIGH (ref 0.0–1.9)

## 2014-07-20 LAB — LIPID PANEL
CHOLESTEROL: 195 mg/dL (ref 0–200)
HDL: 47.6 mg/dL (ref >39.00)
LDL CALC: 127 mg/dL — AB (ref 0–99)
NonHDL: 147.4
TRIGLYCERIDES: 100 mg/dL (ref 0.0–149.0)
Total CHOL/HDL Ratio: 4
VLDL: 20 mg/dL (ref 0.0–40.0)

## 2014-07-20 LAB — TSH: TSH: 4.44 u[IU]/mL (ref 0.35–4.50)

## 2014-07-20 LAB — VITAMIN D 25 HYDROXY (VIT D DEFICIENCY, FRACTURES): VITD: 20.94 ng/mL — ABNORMAL LOW (ref 30.00–100.00)

## 2014-07-20 NOTE — Patient Instructions (Signed)
Follow up in 3-4 months to recheck diabetes We'll notify you of your lab results and make any changes if needed We'll call you with your mammogram appt Please schedule your eye exam Keep up the good work on healthy diet and regular exercise Call with any questions or concerns Have a great summer!!!

## 2014-07-20 NOTE — Assessment & Plan Note (Signed)
Pt's PE WNL w/ exception of obesity.  Due for mammo- order entered.  Pap collected today.  Check labs.  Anticipatory guidance provided.

## 2014-07-20 NOTE — Progress Notes (Signed)
   Subjective:    Patient ID: Lisa Patel, female    DOB: 1970/04/28, 44 y.o.   MRN: [id number]  HPI CPE- due for pap today.  Due for mammo [facility]).  Due for eye exam- pt plans to schedule.   Review of Systems Patient reports no vision/ hearing changes, adenopathy,fever, weight change,  persistant/recurrent hoarseness , swallowing issues, chest pain, palpitations, edema, persistant/recurrent cough, hemoptysis, dyspnea (rest/exertional/paroxysmal nocturnal), gastrointestinal bleeding (melena, rectal bleeding), abdominal pain, significant heartburn, bowel changes, GU symptoms (dysuria, hematuria, incontinence), Gyn symptoms (abnormal  bleeding, pain),  syncope, focal weakness, memory loss, numbness & tingling, skin/hair/nail changes, abnormal bruising or bleeding, anxiety, or depression.     Objective:   Physical Exam  General Appearance:    Alert, cooperative, no distress, appears stated age, obese  Head:    Normocephalic, without obvious abnormality, atraumatic  Eyes:    PERRL, conjunctiva/corneas clear, EOM's intact, fundi    benign, both eyes  Ears:    Normal TM's and external ear canals, both ears  Nose:   Nares normal, septum midline, mucosa normal, no drainage    or sinus tenderness  Throat:   Lips, mucosa, and tongue normal; teeth and gums normal  Neck:   Supple, symmetrical, trachea midline, no adenopathy;    Thyroid: no enlargement/tenderness/nodules  Back:     Symmetric, no curvature, ROM normal, no CVA tenderness  Lungs:     Clear to auscultation bilaterally, respirations unlabored  Chest Wall:    No tenderness or deformity   Heart:    Regular rate and rhythm, S1 and S2 normal, no murmur, rub   or gallop  Breast Exam:    No tenderness, masses, or nipple abnormality  Abdomen:     Soft, non-tender, bowel sounds active all four quadrants,    no masses, no organomegaly  Genitalia:    External genitalia normal, cervix normal in appearance, no CMT, uterus in normal size and  position, adnexa w/out mass or tenderness, mucosa pink and moist, no lesions or discharge present  Rectal:    Normal external appearance  Extremities:   Extremities normal, atraumatic, no cyanosis or edema  Pulses:   2+ and symmetric all extremities  Skin:   Skin color, texture, turgor normal, no rashes or lesions  Lymph nodes:   Cervical, supraclavicular, and axillary nodes normal  Neurologic:   CNII-XII intact, normal strength, sensation and reflexes    throughout          Assessment & Plan:

## 2014-07-20 NOTE — Assessment & Plan Note (Signed)
Chronic problem.  Pt has not lost or gained weight since last visit.  Due for eye exam- pt encouraged to schedule.  Not on ACE/ARB- get microalbumin.  Encouraged healthy die and regular exercise.  Will continue to follow.

## 2014-07-20 NOTE — Progress Notes (Signed)
Pre visit review using our clinic review tool, if applicable. No additional management support is needed unless otherwise documented below in the visit note. 

## 2014-07-21 LAB — CBC WITH DIFFERENTIAL/PLATELET
Basophils Absolute: 0 10*3/uL (ref 0.0–0.1)
Basophils Relative: 0.2 % (ref 0.0–3.0)
Eosinophils Absolute: 0.2 10*3/uL (ref 0.0–0.7)
Eosinophils Relative: 2.6 % (ref 0.0–5.0)
HEMATOCRIT: 41.3 % (ref 36.0–46.0)
Hemoglobin: 14 g/dL (ref 12.0–15.0)
LYMPHS ABS: 1.4 10*3/uL (ref 0.7–4.0)
Lymphocytes Relative: 18.6 % (ref 12.0–46.0)
MCHC: 33.9 g/dL (ref 30.0–36.0)
MCV: 86.8 fl (ref 78.0–100.0)
MONO ABS: 0.2 10*3/uL (ref 0.1–1.0)
Monocytes Relative: 2.5 % — ABNORMAL LOW (ref 3.0–12.0)
Neutro Abs: 5.5 10*3/uL (ref 1.4–7.7)
Neutrophils Relative %: 76.1 % (ref 43.0–77.0)
PLATELETS: 215 10*3/uL (ref 150.0–400.0)
RBC: 4.76 Mil/uL (ref 3.87–5.11)
RDW: 13.8 % (ref 11.5–15.5)
WBC: 7.3 10*3/uL (ref 4.0–10.5)

## 2014-07-21 LAB — CYTOLOGY - PAP

## 2014-07-22 ENCOUNTER — Other Ambulatory Visit: Payer: Self-pay | Admitting: Family Medicine

## 2014-07-22 ENCOUNTER — Other Ambulatory Visit: Payer: Self-pay | Admitting: General Practice

## 2014-07-22 DIAGNOSIS — E038 Other specified hypothyroidism: Secondary | ICD-10-CM

## 2014-07-22 LAB — HM MAMMOGRAPHY

## 2014-07-22 MED ORDER — LEVOTHYROXINE SODIUM 88 MCG PO TABS: 88.0000 ug | ORAL_TABLET | Freq: Every day | ORAL | Status: DC

## 2014-07-22 MED ORDER — LISINOPRIL 5 MG PO TABS: 5.0000 mg | ORAL_TABLET | Freq: Every day | ORAL | Status: DC

## 2014-07-22 MED ORDER — VITAMIN D (ERGOCALCIFEROL) 1.25 MG (50000 UNIT) PO CAPS: 50000.0000 [IU] | ORAL_CAPSULE | ORAL | Status: DC

## 2014-07-22 NOTE — Telephone Encounter (Signed)
Med filled.  

## 2014-07-26 ENCOUNTER — Encounter: Payer: Self-pay | Admitting: General Practice

## 2014-08-01 LAB — HM DIABETES EYE EXAM

## 2014-08-04 ENCOUNTER — Encounter: Payer: Self-pay | Admitting: General Practice

## 2014-08-31 ENCOUNTER — Other Ambulatory Visit: Payer: Self-pay | Admitting: Family Medicine

## 2014-09-01 NOTE — Telephone Encounter (Signed)
Medication filled to pharmacy as requested.   

## 2014-10-02 ENCOUNTER — Encounter: Payer: Self-pay | Admitting: Family Medicine

## 2014-10-18 ENCOUNTER — Telehealth: Payer: Self-pay | Admitting: Family Medicine

## 2014-10-18 NOTE — Telephone Encounter (Addendum)
Patient scheduled for 10/20/2014 lab only

## 2014-10-18 NOTE — Telephone Encounter (Addendum)
Relation to QB:HALP Call back number: [phone]   Reason for call:  As per 07/20/2014 AVS Follow up in 3-4 months to recheck diabetes patient scheduled lab appointment only for her TSH and would to schedule follow up with PCP at a later date due to work conflict. Patient would like to know if there are any other labs that need to be drawn besides TSH if so requesting orders.

## 2014-10-18 NOTE — Telephone Encounter (Signed)
Pt only needs TSh, these labs were already ordered. Pt needs a Diabetes follow up in October or November.

## 2014-10-20 ENCOUNTER — Other Ambulatory Visit (INDEPENDENT_AMBULATORY_CARE_PROVIDER_SITE_OTHER): Payer: BC Managed Care – PPO

## 2014-10-20 DIAGNOSIS — E038 Other specified hypothyroidism: Secondary | ICD-10-CM | POA: Diagnosis not present

## 2014-10-20 LAB — TSH: TSH: 3.27 u[IU]/mL (ref 0.35–4.50)

## 2015-03-02 ENCOUNTER — Other Ambulatory Visit: Payer: Self-pay

## 2015-03-02 MED ORDER — CITALOPRAM HYDROBROMIDE 20 MG PO TABS: 20.0000 mg | ORAL_TABLET | Freq: Every day | ORAL | Status: DC

## 2015-04-02 ENCOUNTER — Encounter: Payer: Self-pay | Admitting: Family Medicine

## 2015-04-02 ENCOUNTER — Other Ambulatory Visit: Payer: Self-pay | Admitting: Family Medicine

## 2015-04-03 ENCOUNTER — Other Ambulatory Visit: Payer: Self-pay | Admitting: General Practice

## 2015-04-03 MED ORDER — LEVOTHYROXINE SODIUM 88 MCG PO TABS: 88.0000 ug | ORAL_TABLET | Freq: Every day | ORAL | Status: DC

## 2015-04-03 NOTE — Telephone Encounter (Signed)
Medication filled to pharmacy as requested.   

## 2015-04-17 ENCOUNTER — Encounter: Payer: Self-pay | Admitting: Family Medicine

## 2015-04-17 DIAGNOSIS — E1169 Type 2 diabetes mellitus with other specified complication: Secondary | ICD-10-CM

## 2015-04-17 DIAGNOSIS — E785 Hyperlipidemia, unspecified: Secondary | ICD-10-CM

## 2015-04-17 DIAGNOSIS — E119 Type 2 diabetes mellitus without complications: Secondary | ICD-10-CM

## 2015-04-17 NOTE — Telephone Encounter (Signed)
Orders placed.

## 2015-04-20 ENCOUNTER — Ambulatory Visit: Payer: BC Managed Care – PPO | Admitting: Family Medicine

## 2015-04-20 ENCOUNTER — Other Ambulatory Visit (INDEPENDENT_AMBULATORY_CARE_PROVIDER_SITE_OTHER): Payer: BC Managed Care – PPO

## 2015-04-20 DIAGNOSIS — E119 Type 2 diabetes mellitus without complications: Secondary | ICD-10-CM | POA: Diagnosis not present

## 2015-04-20 DIAGNOSIS — E1169 Type 2 diabetes mellitus with other specified complication: Secondary | ICD-10-CM

## 2015-04-20 DIAGNOSIS — E785 Hyperlipidemia, unspecified: Secondary | ICD-10-CM

## 2015-04-20 LAB — HEPATIC FUNCTION PANEL
ALBUMIN: 3.7 g/dL (ref 3.5–5.2)
ALK PHOS: 70 U/L (ref 39–117)
ALT: 16 U/L (ref 0–35)
AST: 21 U/L (ref 0–37)
Bilirubin, Direct: 0.1 mg/dL (ref 0.0–0.3)
TOTAL PROTEIN: 6.7 g/dL (ref 6.0–8.3)
Total Bilirubin: 0.5 mg/dL (ref 0.2–1.2)

## 2015-04-20 LAB — LIPID PANEL
CHOLESTEROL: 177 mg/dL (ref 0–200)
HDL: 43.5 mg/dL (ref >39.00)
LDL Cholesterol: 114 mg/dL — ABNORMAL HIGH (ref 0–99)
NONHDL: 133.86
TRIGLYCERIDES: 101 mg/dL (ref 0.0–149.0)
Total CHOL/HDL Ratio: 4
VLDL: 20.2 mg/dL (ref 0.0–40.0)

## 2015-04-20 LAB — CBC WITH DIFFERENTIAL/PLATELET
BASOS ABS: 0 10*3/uL (ref 0.0–0.1)
BASOS PCT: 0.1 % (ref 0.0–3.0)
Eosinophils Absolute: 0.3 10*3/uL (ref 0.0–0.7)
Eosinophils Relative: 4 % (ref 0.0–5.0)
HCT: 38.4 % (ref 36.0–46.0)
Hemoglobin: 13 g/dL (ref 12.0–15.0)
LYMPHS ABS: 1.4 10*3/uL (ref 0.7–4.0)
Lymphocytes Relative: 20 % (ref 12.0–46.0)
MCHC: 33.8 g/dL (ref 30.0–36.0)
MCV: 84.5 fl (ref 78.0–100.0)
MONOS PCT: 4.7 % (ref 3.0–12.0)
Monocytes Absolute: 0.3 10*3/uL (ref 0.1–1.0)
NEUTROS ABS: 5.1 10*3/uL (ref 1.4–7.7)
NEUTROS PCT: 71.2 % (ref 43.0–77.0)
PLATELETS: 223 10*3/uL (ref 150.0–400.0)
RBC: 4.54 Mil/uL (ref 3.87–5.11)
RDW: 13.7 % (ref 11.5–15.5)
WBC: 7.2 10*3/uL (ref 4.0–10.5)

## 2015-04-20 LAB — BASIC METABOLIC PANEL
BUN: 19 mg/dL (ref 6–23)
CALCIUM: 9.3 mg/dL (ref 8.4–10.5)
CHLORIDE: 104 meq/L (ref 96–112)
CO2: 26 meq/L (ref 19–32)
CREATININE: 0.75 mg/dL (ref 0.40–1.20)
GFR: 88.11 mL/min (ref >60.00)
GLUCOSE: 135 mg/dL — AB (ref 70–99)
Potassium: 4.6 mEq/L (ref 3.5–5.1)
SODIUM: 136 meq/L (ref 135–145)

## 2015-04-20 LAB — TSH: TSH: 3.15 u[IU]/mL (ref 0.35–4.50)

## 2015-04-20 LAB — HEMOGLOBIN A1C: Hgb A1c MFr Bld: 6.4 % (ref 4.6–6.5)

## 2015-05-11 ENCOUNTER — Other Ambulatory Visit: Payer: Self-pay | Admitting: Family Medicine

## 2015-05-11 NOTE — Telephone Encounter (Signed)
Medication filled to pharmacy as requested.   

## 2015-06-14 ENCOUNTER — Other Ambulatory Visit: Payer: Self-pay | Admitting: Family Medicine

## 2015-06-14 NOTE — Telephone Encounter (Signed)
Medication filled to pharmacy as requested.   

## 2015-06-30 ENCOUNTER — Encounter: Payer: Self-pay | Admitting: Behavioral Health

## 2015-06-30 ENCOUNTER — Telehealth: Payer: Self-pay | Admitting: Behavioral Health

## 2015-06-30 NOTE — Telephone Encounter (Signed)
Pre-Visit Call completed with patient and chart updated.   Pre-Visit Info documented in Specialty Comments under [employer].    

## 2015-07-03 ENCOUNTER — Encounter: Payer: Self-pay | Admitting: Family Medicine

## 2015-07-03 ENCOUNTER — Ambulatory Visit (INDEPENDENT_AMBULATORY_CARE_PROVIDER_SITE_OTHER): Payer: BC Managed Care – PPO | Admitting: Family Medicine

## 2015-07-03 VITALS — BP 111/83 | HR 90 | Temp 98.4°F | Ht 65.5 in | Wt 304.4 lb

## 2015-07-03 DIAGNOSIS — E119 Type 2 diabetes mellitus without complications: Secondary | ICD-10-CM | POA: Diagnosis not present

## 2015-07-03 DIAGNOSIS — R062 Wheezing: Secondary | ICD-10-CM

## 2015-07-03 DIAGNOSIS — Z0000 Encounter for general adult medical examination without abnormal findings: Secondary | ICD-10-CM

## 2015-07-03 MED ORDER — ALBUTEROL SULFATE HFA 108 (90 BASE) MCG/ACT IN AERS
2.0000 | INHALATION_SPRAY | Freq: Four times a day (QID) | RESPIRATORY_TRACT | Status: DC | PRN
Start: 2015-07-03 — End: 2019-01-01

## 2015-07-03 MED ORDER — MONTELUKAST SODIUM 10 MG PO TABS: 10.0000 mg | ORAL_TABLET | Freq: Every day | ORAL | Status: DC

## 2015-07-03 NOTE — Progress Notes (Signed)
Pre visit review using our clinic review tool, if applicable. No additional management support is needed unless otherwise documented below in the visit note. 

## 2015-07-03 NOTE — Progress Notes (Signed)
[facility] [address], [place], [place] [address] [phone]  Date:  07/03/2015   Name:  Lisa Patel   DOB:  January 22, 1970   MRN:  [id number]  PCP:  Lamar Blinks, MD    Chief Complaint: Establish Care   History of Present Illness:  Scott Mallicoat is a 45 y.o. very pleasant female patient who presents with the following:  Here today for a CPE History of obesity, hypothyroidism, DM (diet controlled).  Last labs in April of this year mammo 07/2014 Pap was done last year- negative HPV. Does not need repeat today She has noted some wheezing over the last 6 months.  This does not seem to be related to any allergen, exercise.  No SOB. She has never been a smoker She does work in an old [employer].  She is not sure if she might be exposed to mold, etc at her job She doe not use an inhaler and has never been dx with asthma in the past  She also did notice some marks on her legs over the last couple of weeks- appeared to be superficial bruises.  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    She is married, has 2 children ages 55 and 3.     No cough No hemopthysis, no unexpected weight loss Her husband is s/p vasectomy  Wt Readings from Last 3 Encounters:  07/03/15 304 lb 6.4 oz (138.075 kg)  07/20/14 302 lb 4 oz (137.1 kg)  12/28/13 302 lb 3.2 oz (137.077 kg)     Lab Results  Component Value Date   HGBA1C 6.4 04/20/2015     Patient Active Problem List   Diagnosis Date Noted  . Knee injury 12/28/2013  . Encounter for routine gynecological examination 09/11/2011  . Numbness of foot 09/11/2011  . Hypothyroid 09/11/2011  . Vaginitis 01/09/2011  . Screening for malignant neoplasm of cervix 04/23/2010  . Diet-controlled diabetes mellitus ([place]) 10/02/2009  . Hyperlipidemia associated with  type 2 diabetes mellitus ([place]) 10/02/2009  . ANXIETY STATE, UNSPECIFIED 09/04/2009  . PARESTHESIA 09/04/2009  . SNORING 09/04/2009  . VITAMIN D DEFICIENCY 08/31/2008  . HYPERKALEMIA 08/31/2008  . GERD 08/31/2008  . IMPAIRED FASTING GLUCOSE 08/31/2008    Past Medical History  Diagnosis Date  . GERD (gastroesophageal reflux disease)   . Vitamin D deficiency   . Obesity   . Diabetes mellitus   . Hyperlipidemia   . Thyroid disease   . Panic attack     No past surgical history on file.  Social History  Substance Use Topics  . Smoking status: Never Smoker   . Smokeless tobacco: Never Used  . Alcohol Use: None    Family History  Problem Relation Age of Onset  . Diabetes Maternal Grandmother   . Diabetes Maternal Grandfather   . Diabetes Paternal Grandmother   . Diabetes Paternal Grandfather     No Known Allergies  Medication list has been reviewed and updated.  Current Outpatient Prescriptions on File Prior to Visit  Medication Sig Dispense Refill  . citalopram (CELEXA) 20 MG tablet TAKE 1 TABLET(20 MG) BY MOUTH DAILY 30 tablet 2  . levothyroxine (SYNTHROID, LEVOTHROID) 88 MCG tablet TAKE 1 TABLET(88 MCG) BY MOUTH DAILY 30 tablet 6  . lisinopril (PRINIVIL,ZESTRIL) 5 MG tablet TAKE 1 TABLET(5 MG)  BY MOUTH DAILY 30 tablet 2  . loratadine-pseudoephedrine (CLARITIN-D 24 HOUR) 10-240 MG per 24 hr tablet Take 1 tablet by mouth daily.      . Multiple Vitamin (MULTIVITAMIN) tablet Take 1 tablet by mouth daily.    Marland Kitchen omeprazole (PRILOSEC) 20 MG capsule Take 20 mg by mouth daily.     No current facility-administered medications on file prior to visit.    Review of Systems:  As per HPI- otherwise negative.   Physical Examination: Filed Vitals:   07/03/15 0935  BP: 111/83  Pulse: 90  Temp: 98.4 F (36.9 C)   Filed Vitals:   07/03/15 0935  Height: 5' 5.5" (1.664 m)  Weight: 304 lb 6.4 oz (138.075 kg)   Body mass index is 49.87 kg/(m^2). Ideal Body Weight: Weight in  (lb) to have BMI = 25: 152.2  GEN: WDWN, NAD, Non-toxic, A & O x 3, quite obese, OW looks well HEENT: Atraumatic, Normocephalic. Neck supple. No masses, No LAD.  Bilateral TM wnl, oropharynx normal.  PEERL,EOMI.   Ears and Nose: No external deformity. CV: RRR, No M/G/R. No JVD. No thrill. No extra heart sounds. PULM: CTA B, no  crackles, rhonchi. No retractions. No resp. distress. No accessory muscle use. She does have slight wheeze bilaterally with forceful expiration ABD: S, NT, ND, +BS. No rebound. No HSM. EXTR: No c/c/e.  No abnormality of LE exam- no swelling, tenderness, or cords noted  NEURO Normal gait.  PSYCH: Normally interactive. Conversant. Not depressed or anxious appearing.  Calm demeanor.  Breast: normal exam, no masses/ dimpling/ discharge   Assessment and Plan: Physical exam  Expiratory wheezing - Plan: montelukast (SINGULAIR) 10 MG tablet, albuterol (PROVENTIL HFA;VENTOLIN HFA) 108 (90 Base) MCG/ACT inhaler  Diet-controlled diabetes mellitus ([facility])  Morbid obesity, unspecified obesity type ([place])  Here today for a CPE Labs are recent and not due today Other health maint is UTD Recent A1c ok Start on singulair and also gave an albuterol inhaler to have on hand    Signed Lamar Blinks, MD

## 2015-07-03 NOTE — Patient Instructions (Signed)
It was great to see you today!  Take care and let me know if the singulair is not helpful for your wheezing Let's plan to meet back in 4-6 months for labs Let me know if you have any more concerns about swelling or discoloration of your legs

## 2015-07-06 ENCOUNTER — Other Ambulatory Visit: Payer: Self-pay | Admitting: Family Medicine

## 2015-07-30 ENCOUNTER — Other Ambulatory Visit: Payer: Self-pay | Admitting: Family Medicine

## 2016-02-10 ENCOUNTER — Other Ambulatory Visit: Payer: Self-pay | Admitting: Family Medicine

## 2016-02-10 DIAGNOSIS — R062 Wheezing: Secondary | ICD-10-CM

## 2016-02-12 ENCOUNTER — Encounter: Payer: Self-pay | Admitting: Family Medicine

## 2016-02-13 ENCOUNTER — Other Ambulatory Visit: Payer: Self-pay | Admitting: Emergency Medicine

## 2016-02-13 DIAGNOSIS — R062 Wheezing: Secondary | ICD-10-CM

## 2016-02-13 MED ORDER — MONTELUKAST SODIUM 10 MG PO TABS: 10.0000 mg | ORAL_TABLET | Freq: Every day | ORAL | 6 refills | Status: DC

## 2016-02-13 NOTE — Telephone Encounter (Signed)
Received refill request for montelukast (SINGULAIR) 10 MG tablet. Last office visit and refill on 07/03/15. Refill sent to pharmacy.

## 2016-03-20 ENCOUNTER — Other Ambulatory Visit: Payer: Self-pay | Admitting: Family Medicine

## 2016-07-17 ENCOUNTER — Other Ambulatory Visit: Payer: Self-pay | Admitting: Family Medicine

## 2016-07-29 ENCOUNTER — Encounter: Payer: Self-pay | Admitting: Family Medicine

## 2016-07-30 NOTE — Progress Notes (Addendum)
[place] [facility] [address], [place], [place] [address] [phone]  Date:  08/01/2016   Name:  Lisa Patel   DOB:  [birth year]   MRN:  [id number]  PCP:  Darreld Mclean, MD    Chief Complaint: Annual Exam (Last PAP: 2016, due again in 2019. Scheduled for Mammogram 08/02/16. )   History of Present Illness:  Lisa Patel is a 46 y.o. very pleasant female patient who presents with the following:  Last seen by myself about one year ago  History of obesity, hypothyroidism, DM (diet controlled).  Last labs in April of this year mammo 07/2014 Pap was done last year- negative HPV. Does not need repeat today She has noted some wheezing over the last 6 months.  This does not seem to be related to any allergen, exercise.  No SOB. She has never been a smoker She does work in an old [employer].  She is not sure if she might be exposed to mold, etc at her job She doe not use an inhaler and has never been dx with asthma in the past  She also did notice some marks on her legs over the last couple of weeks- appeared to be superficial bruises.  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    She is married, has 2 children ages 70 and 5.     Needs foot exam, eye exam, A1c/ labs  They bought a new house and are very busy with this  She will start back at school in about one month  Last pap about 2 years ago, normal. Never had any abnormal result Still taking celexa and it is still working for her, she feels that her mood is ok Her mammogram is tomorrow Her eye exam is next week  She is fasting today for her labs   She is still taking her prilosec- she gets this OTC  Her husband had a vasectomy years ago  Lab Results  Component Value Date   HGBA1C 6.4 04/20/2015    Patient Active Problem List   Diagnosis Date Noted  . Knee injury 12/28/2013  . Encounter for routine gynecological examination 09/11/2011  . Numbness of foot 09/11/2011  . Hypothyroid 09/11/2011  . Vaginitis 01/09/2011  . Screening for malignant neoplasm of cervix 04/23/2010  . Diet-controlled diabetes mellitus ([place]) 10/02/2009  . Hyperlipidemia associated with type 2 diabetes mellitus ([place]) 10/02/2009  . ANXIETY STATE, UNSPECIFIED 09/04/2009  . PARESTHESIA 09/04/2009  . SNORING 09/04/2009  . VITAMIN D DEFICIENCY 08/31/2008  . HYPERKALEMIA 08/31/2008  . GERD 08/31/2008  . IMPAIRED FASTING GLUCOSE 08/31/2008    Past Medical History:  Diagnosis Date  . Diabetes mellitus   . GERD (gastroesophageal reflux disease)   . Hyperlipidemia   . Obesity   . Panic attack   . Thyroid disease   . Vitamin D deficiency     No past surgical history on file.  Social History  Substance Use Topics  . Smoking status: Never Smoker  . Smokeless tobacco: Never Used  . Alcohol use Not on file    Family History  Problem Relation Age of Onset  . Diabetes Maternal Grandmother   . Diabetes Maternal Grandfather   . Diabetes Paternal Grandmother   . Diabetes Paternal Grandfather     No Known Allergies  Medication list has been reviewed and updated.  Current Outpatient Prescriptions on File Prior to Visit  Medication Sig Dispense Refill  . citalopram (CELEXA) 20 MG tablet TAKE 1 TABLET(20 MG) BY MOUTH DAILY 90 tablet 0  . levothyroxine (SYNTHROID, LEVOTHROID) 88 MCG tablet TAKE 1 TABLET(88 MCG) BY MOUTH DAILY 90 tablet 0  . lisinopril (PRINIVIL,ZESTRIL) 5 MG tablet TAKE 1 TABLET(5 MG) BY MOUTH DAILY 30 tablet 11  . loratadine-pseudoephedrine (CLARITIN-D 24 HOUR) 10-240 MG per 24 hr tablet Take 1 tablet by mouth daily.      . montelukast (SINGULAIR) 10 MG tablet Take 1 tablet (10 mg total) by mouth at bedtime. 30 tablet 6  . Multiple Vitamin (MULTIVITAMIN) tablet Take 1 tablet by mouth daily.    Marland Kitchen omeprazole (PRILOSEC) 20  MG capsule Take 20 mg by mouth daily.    Marland Kitchen albuterol (PROVENTIL HFA;VENTOLIN HFA) 108 (90 Base) MCG/ACT inhaler Inhale 2 puffs into the lungs every 6 (six) hours as needed for wheezing or shortness of breath. (Patient not taking: Reported on 08/01/2016) 1 Inhaler 0   No current facility-administered medications on file prior to visit.     Review of Systems:  As per HPI- otherwise negative. No fever or chills No CP or SOB   Physical Examination: Vitals:   08/01/16 1035  BP: 112/78  Pulse: 73  Temp: 98.2 F (36.8 C)   Vitals:   08/01/16 1035  Weight: (!) 302 lb 9.6 oz (137.3 kg)  Height: 5' 5.75" (1.67 m)   Body mass index is 49.21 kg/m. Ideal Body Weight: Weight in (lb) to have BMI = 25: 153.4  GEN: WDWN, NAD, Non-toxic, A & O x 3, obese, otherwise looks well HEENT: Atraumatic, Normocephalic. Neck supple. No masses, No LAD. Ears and Nose: No external deformity. CV: RRR, No M/G/R. No JVD. No thrill. No extra heart sounds. PULM: CTA B, no wheezes, crackles, rhonchi. No retractions. No resp. distress. No accessory muscle use. ABD: S, NT, ND, +BS. No rebound. No HSM. EXTR: No c/c/e NEURO Normal gait.  PSYCH: Normally interactive. Conversant. Not depressed or anxious appearing.  Calm demeanor.  She has a mole on her left sided back just above the bra which is different in apperaance from her other nevi   Assessment and Plan: Physical exam  Diet-controlled diabetes mellitus ([place]) - Plan: Comprehensive metabolic panel, Hemoglobin A1c, lisinopril (PRINIVIL,ZESTRIL) 5 MG tablet  Morbid obesity, unspecified obesity type ([place]) - Plan: Lipid panel  Hyperlipidemia associated with type 2 diabetes mellitus ([place]) - Plan: Lipid panel  Hypothyroidism (acquired) - Plan: TSH, levothyroxine (SYNTHROID, LEVOTHROID) 88 MCG tablet  Screening for deficiency anemia - Plan: CBC  Anxiety state - Plan: citalopram (CELEXA) 20 MG tablet  Atypical nevus  Here today for a CPE Await labs and  will be in touch with her asap Refilled medications as needed BP is well controlled She will come and see me to have mole removed at her convenience   Signed Lamar Blinks, MD  Received her lab results and gave her a call All looks ok except her TSH is high.  We will increase her thyroid dose from 88 to 100 mcg.  Called pharm and canceled the 88 mcg.    Message to pt with more details   Results for orders placed or performed in visit on 08/01/16  CBC  Result Value Ref Range   WBC 7.7 4.0 - 10.5 K/uL   RBC 4.56 3.87 - 5.11 Mil/uL   Platelets 252.0 150.0 - 400.0  K/uL   Hemoglobin 13.2 12.0 - 15.0 g/dL   HCT 39.4 36.0 - 46.0 %   MCV 86.6 78.0 - 100.0 fl   MCHC 33.4 30.0 - 36.0 g/dL   RDW 13.7 11.5 - 15.5 %  Comprehensive metabolic panel  Result Value Ref Range   Sodium 137 135 - 145 mEq/L   Potassium 4.9 3.5 - 5.1 mEq/L   Chloride 103 96 - 112 mEq/L   CO2 25 19 - 32 mEq/L   Glucose, Bld 131 (H) 70 - 99 mg/dL   BUN 22 6 - 23 mg/dL   Creatinine, Ser 0.81 0.40 - 1.20 mg/dL   Total Bilirubin 0.8 0.2 - 1.2 mg/dL   Alkaline Phosphatase 66 39 - 117 U/L   AST 15 0 - 37 U/L   ALT 10 0 - 35 U/L   Total Protein 7.0 6.0 - 8.3 g/dL   Albumin 3.9 3.5 - 5.2 g/dL   Calcium 9.6 8.4 - 10.5 mg/dL   GFR 80.18 >60.00 mL/min  Hemoglobin A1c  Result Value Ref Range   Hgb A1c MFr Bld 6.6 (H) 4.6 - 6.5 %  Lipid panel  Result Value Ref Range   Cholesterol 180 0 - 200 mg/dL   Triglycerides 112.0 0.0 - 149.0 mg/dL   HDL 45.40 >39.00 mg/dL   VLDL 22.4 0.0 - 40.0 mg/dL   LDL Cholesterol 112 (H) 0 - 99 mg/dL   Total CHOL/HDL Ratio 4    NonHDL 134.36   TSH  Result Value Ref Range   TSH 7.91 (H) 0.35 - 4.50 uIU/mL

## 2016-08-01 ENCOUNTER — Ambulatory Visit (INDEPENDENT_AMBULATORY_CARE_PROVIDER_SITE_OTHER): Payer: BC Managed Care – PPO | Admitting: Family Medicine

## 2016-08-01 ENCOUNTER — Encounter: Payer: Self-pay | Admitting: Family Medicine

## 2016-08-01 VITALS — BP 112/78 | HR 73 | Temp 98.2°F | Ht 65.75 in | Wt 302.6 lb

## 2016-08-01 DIAGNOSIS — E039 Hypothyroidism, unspecified: Secondary | ICD-10-CM

## 2016-08-01 DIAGNOSIS — E1169 Type 2 diabetes mellitus with other specified complication: Secondary | ICD-10-CM | POA: Diagnosis not present

## 2016-08-01 DIAGNOSIS — D229 Melanocytic nevi, unspecified: Secondary | ICD-10-CM

## 2016-08-01 DIAGNOSIS — Z0000 Encounter for general adult medical examination without abnormal findings: Secondary | ICD-10-CM

## 2016-08-01 DIAGNOSIS — F411 Generalized anxiety disorder: Secondary | ICD-10-CM

## 2016-08-01 DIAGNOSIS — E119 Type 2 diabetes mellitus without complications: Secondary | ICD-10-CM

## 2016-08-01 DIAGNOSIS — Z130 Encounter for screening for diseases of the blood and blood-forming organs and certain disorders involving the immune mechanism: Secondary | ICD-10-CM | POA: Diagnosis not present

## 2016-08-01 DIAGNOSIS — E785 Hyperlipidemia, unspecified: Secondary | ICD-10-CM | POA: Diagnosis not present

## 2016-08-01 LAB — COMPREHENSIVE METABOLIC PANEL
ALT: 10 U/L (ref 0–35)
AST: 15 U/L (ref 0–37)
Albumin: 3.9 g/dL (ref 3.5–5.2)
Alkaline Phosphatase: 66 U/L (ref 39–117)
BUN: 22 mg/dL (ref 6–23)
CHLORIDE: 103 meq/L (ref 96–112)
CO2: 25 mEq/L (ref 19–32)
Calcium: 9.6 mg/dL (ref 8.4–10.5)
Creatinine, Ser: 0.81 mg/dL (ref 0.40–1.20)
GFR: 80.18 mL/min (ref >60.00)
GLUCOSE: 131 mg/dL — AB (ref 70–99)
POTASSIUM: 4.9 meq/L (ref 3.5–5.1)
SODIUM: 137 meq/L (ref 135–145)
Total Bilirubin: 0.8 mg/dL (ref 0.2–1.2)
Total Protein: 7 g/dL (ref 6.0–8.3)

## 2016-08-01 LAB — LIPID PANEL
Cholesterol: 180 mg/dL (ref 0–200)
HDL: 45.4 mg/dL (ref >39.00)
LDL Cholesterol: 112 mg/dL — ABNORMAL HIGH (ref 0–99)
NONHDL: 134.36
TRIGLYCERIDES: 112 mg/dL (ref 0.0–149.0)
Total CHOL/HDL Ratio: 4
VLDL: 22.4 mg/dL (ref 0.0–40.0)

## 2016-08-01 LAB — CBC
HEMATOCRIT: 39.4 % (ref 36.0–46.0)
Hemoglobin: 13.2 g/dL (ref 12.0–15.0)
MCHC: 33.4 g/dL (ref 30.0–36.0)
MCV: 86.6 fl (ref 78.0–100.0)
Platelets: 252 10*3/uL (ref 150.0–400.0)
RBC: 4.56 Mil/uL (ref 3.87–5.11)
RDW: 13.7 % (ref 11.5–15.5)
WBC: 7.7 10*3/uL (ref 4.0–10.5)

## 2016-08-01 LAB — TSH: TSH: 7.91 u[IU]/mL — ABNORMAL HIGH (ref 0.35–4.50)

## 2016-08-01 LAB — HEMOGLOBIN A1C: HEMOGLOBIN A1C: 6.6 % — AB (ref 4.6–6.5)

## 2016-08-01 MED ORDER — LEVOTHYROXINE SODIUM 88 MCG PO TABS: ORAL_TABLET | ORAL | 3 refills | Status: DC

## 2016-08-01 MED ORDER — LISINOPRIL 5 MG PO TABS: ORAL_TABLET | ORAL | 3 refills | Status: DC

## 2016-08-01 MED ORDER — CITALOPRAM HYDROBROMIDE 20 MG PO TABS: ORAL_TABLET | ORAL | 3 refills | Status: DC

## 2016-08-01 NOTE — Patient Instructions (Addendum)
It was a pleasure to see you as always- congrats on your new home!   I will be in touch with your labs asap Please see me at your convenience to have a biopsy of the mole on your back; please ask for a 30 minute appt for this procedure You are taking prilosec with is a PPI medication- there is some evidence now that H2 blockers (such as pepcid or zantac) may be safer for long term use.  Your might try an H2 blocker and see if it controls your symptoms as well   Health Maintenance, Female Adopting a healthy lifestyle and getting preventive care can go a long way to promote health and wellness. Talk with your health care provider about what schedule of regular examinations is right for you. This is a good chance for you to check in with your provider about disease prevention and staying healthy. In between checkups, there are plenty of things you can do on your own. Experts have done a lot of research about which lifestyle changes and preventive measures are most likely to keep you healthy. Ask your health care provider for more information. Weight and diet Eat a healthy diet  Be sure to include plenty of vegetables, fruits, low-fat dairy products, and lean protein.  Do not eat a lot of foods high in solid fats, added sugars, or salt.  Get regular exercise. This is one of the most important things you can do for your health. ? Most adults should exercise for at least 150 minutes each week. The exercise should increase your heart rate and make you sweat (moderate-intensity exercise). ? Most adults should also do strengthening exercises at least twice a week. This is in addition to the moderate-intensity exercise.  Maintain a healthy weight  Body mass index (BMI) is a measurement that can be used to identify possible weight problems. It estimates body fat based on height and weight. Your health care provider can help determine your BMI and help you achieve or maintain a healthy weight.  For  females 63 years of age and older: ? A BMI below 18.5 is considered underweight. ? A BMI of 18.5 to 24.9 is normal. ? A BMI of 25 to 29.9 is considered overweight. ? A BMI of 30 and above is considered obese.  Watch levels of cholesterol and blood lipids  You should start having your blood tested for lipids and cholesterol at 48 years of age, then have this test every 5 years.  You may need to have your cholesterol levels checked more often if: ? Your lipid or cholesterol levels are high. ? You are older than 48 years of age. ? You are at high risk for heart disease.  Cancer screening Lung Cancer  Lung cancer screening is recommended for adults 10-110 years old who are at high risk for lung cancer because of a history of smoking.  A yearly low-dose CT scan of the lungs is recommended for people who: ? Currently smoke. ? Have quit within the past 15 years. ? Have at least a 30-pack-year history of smoking. A pack year is smoking an average of one pack of cigarettes a day for 1 year.  Yearly screening should continue until it has been 15 years since you quit.  Yearly screening should stop if you develop a health problem that would prevent you from having lung cancer treatment.  Breast Cancer  Practice breast self-awareness. This means understanding how your breasts normally appear and feel.  It  also means doing regular breast self-exams. Let your health care provider know about any changes, no matter how small.  If you are in your 20s or 30s, you should have a clinical breast exam (CBE) by a health care provider every 1-3 years as part of a regular health exam.  If you are 5 or older, have a CBE every year. Also consider having a breast X-ray (mammogram) every year.  If you have a family history of breast cancer, talk to your health care provider about genetic screening.  If you are at high risk for breast cancer, talk to your health care provider about having an MRI and a  mammogram every year.  Breast cancer gene (BRCA) assessment is recommended for women who have family members with BRCA-related cancers. BRCA-related cancers include: ? Breast. ? Ovarian. ? Tubal. ? Peritoneal cancers.  Results of the assessment will determine the need for genetic counseling and BRCA1 and BRCA2 testing.  Cervical Cancer Your health care provider may recommend that you be screened regularly for cancer of the pelvic organs (ovaries, uterus, and vagina). This screening involves a pelvic examination, including checking for microscopic changes to the surface of your cervix (Pap test). You may be encouraged to have this screening done every 3 years, beginning at age 1.  For women ages 12-65, health care providers may recommend pelvic exams and Pap testing every 3 years, or they may recommend the Pap and pelvic exam, combined with testing for human papilloma virus (HPV), every 5 years. Some types of HPV increase your risk of cervical cancer. Testing for HPV may also be done on women of any age with unclear Pap test results.  Other health care providers may not recommend any screening for nonpregnant women who are considered low risk for pelvic cancer and who do not have symptoms. Ask your health care provider if a screening pelvic exam is right for you.  If you have had past treatment for cervical cancer or a condition that could lead to cancer, you need Pap tests and screening for cancer for at least 20 years after your treatment. If Pap tests have been discontinued, your risk factors (such as having a new sexual partner) need to be reassessed to determine if screening should resume. Some women have medical problems that increase the chance of getting cervical cancer. In these cases, your health care provider may recommend more frequent screening and Pap tests.  Colorectal Cancer  This type of cancer can be detected and often prevented.  Routine colorectal cancer screening usually  begins at 48 years of age and continues through 48 years of age.  Your health care provider may recommend screening at an earlier age if you have risk factors for colon cancer.  Your health care provider may also recommend using home test kits to check for hidden blood in the stool.  A small camera at the end of a tube can be used to examine your colon directly (sigmoidoscopy or colonoscopy). This is done to check for the earliest forms of colorectal cancer.  Routine screening usually begins at age 21.  Direct examination of the colon should be repeated every 5-10 years through 48 years of age. However, you may need to be screened more often if early forms of precancerous polyps or small growths are found.  Skin Cancer  Check your skin from head to toe regularly.  Tell your health care provider about any new moles or changes in moles, especially if there is a change  in a mole's shape or color.  Also tell your health care provider if you have a mole that is larger than the size of a pencil eraser.  Always use sunscreen. Apply sunscreen liberally and repeatedly throughout the day.  Protect yourself by wearing long sleeves, pants, a wide-brimmed hat, and sunglasses whenever you are outside.  Heart disease, diabetes, and high blood pressure  High blood pressure causes heart disease and increases the risk of stroke. High blood pressure is more likely to develop in: ? People who have blood pressure in the high end of the normal range (130-139/85-89 mm Hg). ? People who are overweight or obese. ? People who are African American.  If you are 60-60 years of age, have your blood pressure checked every 3-5 years. If you are 3 years of age or older, have your blood pressure checked every year. You should have your blood pressure measured twice-once when you are at a hospital or clinic, and once when you are not at a hospital or clinic. Record the average of the two measurements. To check your  blood pressure when you are not at a hospital or clinic, you can use: ? An automated blood pressure machine at a pharmacy. ? A home blood pressure monitor.  If you are between 77 years and 42 years old, ask your health care provider if you should take aspirin to prevent strokes.  Have regular diabetes screenings. This involves taking a blood sample to check your fasting blood sugar level. ? If you are at a normal weight and have a low risk for diabetes, have this test once every three years after 48 years of age. ? If you are overweight and have a high risk for diabetes, consider being tested at a younger age or more often. Preventing infection Hepatitis B  If you have a higher risk for hepatitis B, you should be screened for this virus. You are considered at high risk for hepatitis B if: ? You were born in a country where hepatitis B is common. Ask your health care provider which countries are considered high risk. ? Your parents were born in a high-risk country, and you have not been immunized against hepatitis B (hepatitis B vaccine). ? You have HIV or AIDS. ? You use needles to inject street drugs. ? You live with someone who has hepatitis B. ? You have had sex with someone who has hepatitis B. ? You get hemodialysis treatment. ? You take certain medicines for conditions, including cancer, organ transplantation, and autoimmune conditions.  Hepatitis C  Blood testing is recommended for: ? Everyone born from [birth year] through 1967. ? Anyone with known risk factors for hepatitis C.  Sexually transmitted infections (STIs)  You should be screened for sexually transmitted infections (STIs) including gonorrhea and chlamydia if: ? You are sexually active and are younger than 48 years of age. ? You are older than 48 years of age and your health care provider tells you that you are at risk for this type of infection. ? Your sexual activity has changed since you were last screened and you are at  an increased risk for chlamydia or gonorrhea. Ask your health care provider if you are at risk.  If you do not have HIV, but are at risk, it may be recommended that you take a prescription medicine daily to prevent HIV infection. This is called pre-exposure prophylaxis (PrEP). You are considered at risk if: ? You are sexually active and do not regularly use  condoms or know the HIV status of your partner(s). ? You take drugs by injection. ? You are sexually active with a partner who has HIV.  Talk with your health care provider about whether you are at high risk of being infected with HIV. If you choose to begin PrEP, you should first be tested for HIV. You should then be tested every 3 months for as long as you are taking PrEP. Pregnancy  If you are premenopausal and you may become pregnant, ask your health care provider about preconception counseling.  If you may become pregnant, take 400 to 800 micrograms (mcg) of folic acid every day.  If you want to prevent pregnancy, talk to your health care provider about birth control (contraception). Osteoporosis and menopause  Osteoporosis is a disease in which the bones lose minerals and strength with aging. This can result in serious bone fractures. Your risk for osteoporosis can be identified using a bone density scan.  If you are 73 years of age or older, or if you are at risk for osteoporosis and fractures, ask your health care provider if you should be screened.  Ask your health care provider whether you should take a calcium or vitamin D supplement to lower your risk for osteoporosis.  Menopause may have certain physical symptoms and risks.  Hormone replacement therapy may reduce some of these symptoms and risks. Talk to your health care provider about whether hormone replacement therapy is right for you. Follow these instructions at home:  Schedule regular health, dental, and eye exams.  Stay current with your immunizations.  Do not  use any tobacco products including cigarettes, chewing tobacco, or electronic cigarettes.  If you are pregnant, do not drink alcohol.  If you are breastfeeding, limit how much and how often you drink alcohol.  Limit alcohol intake to no more than 1 drink per day for nonpregnant women. One drink equals 12 ounces of beer, 5 ounces of wine, or 1 ounces of hard liquor.  Do not use street drugs.  Do not share needles.  Ask your health care provider for help if you need support or information about quitting drugs.  Tell your health care provider if you often feel depressed.  Tell your health care provider if you have ever been abused or do not feel safe at home. This information is not intended to replace advice given to you by your health care provider. Make sure you discuss any questions you have with your health care provider. Document Released: 07/16/2010 Document Revised: 06/08/2015 Document Reviewed: 10/04/2014 Elsevier Interactive Patient Education  [employer].

## 2016-08-02 ENCOUNTER — Encounter: Payer: Self-pay | Admitting: Family Medicine

## 2016-08-02 MED ORDER — LEVOTHYROXINE SODIUM 100 MCG PO TABS: 100.0000 ug | ORAL_TABLET | Freq: Every day | ORAL | 3 refills | Status: DC

## 2016-08-02 NOTE — Addendum Note (Signed)
Addended by: Lamar Blinks C on: 08/02/2016 03:04 PM   Modules accepted: Orders

## 2016-08-14 ENCOUNTER — Ambulatory Visit: Payer: BC Managed Care – PPO | Admitting: Family Medicine

## 2016-08-21 ENCOUNTER — Ambulatory Visit (INDEPENDENT_AMBULATORY_CARE_PROVIDER_SITE_OTHER): Payer: BC Managed Care – PPO | Admitting: Family Medicine

## 2016-08-21 DIAGNOSIS — D229 Melanocytic nevi, unspecified: Secondary | ICD-10-CM | POA: Diagnosis not present

## 2016-08-21 NOTE — Progress Notes (Signed)
[facility] [facility] [address] [place], [place] [address] [phone]  Date:  08/21/2016   Name:  Lisa Patel   DOB:  August 15, 1970   MRN:  [id number]  PCP:  Darreld Mclean, MD    Chief Complaint: No chief complaint on file.   History of Present Illness:  Lisa Patel is a 46 y.o. very pleasant female patient who presents with the following:  Here today to have a mole removed from her back-  We had noted this mole at a recent visit.  She is not really sure how long it has been present, but its appearance is a bit different from her typical nevi She is feeing well today, no concerns.  She is preparing to go back to school for the upcoming year in a couple of week  Patient Active Problem List   Diagnosis Date Noted  . Knee injury 12/28/2013  . Encounter for routine gynecological examination 09/11/2011  . Numbness of foot 09/11/2011  . Hypothyroid 09/11/2011  . Vaginitis 01/09/2011  . Screening for malignant neoplasm of cervix 04/23/2010  . Diet-controlled diabetes mellitus ([place]) 10/02/2009  . Hyperlipidemia associated with type 2 diabetes mellitus ([place]) 10/02/2009  . ANXIETY STATE, UNSPECIFIED 09/04/2009  . PARESTHESIA 09/04/2009  . SNORING 09/04/2009  . VITAMIN D DEFICIENCY 08/31/2008  . HYPERKALEMIA 08/31/2008  . GERD 08/31/2008  . IMPAIRED FASTING GLUCOSE 08/31/2008    Past Medical History:  Diagnosis Date  . Diabetes mellitus   . GERD (gastroesophageal reflux disease)   . Hyperlipidemia   . Obesity   . Panic attack   . Thyroid disease   . Vitamin D deficiency     No past surgical history on file.  Social History  Substance Use Topics  . Smoking status: Never Smoker  . Smokeless tobacco: Never Used  . Alcohol use Not on file    Family History  Problem Relation Age of Onset  . Diabetes Maternal Grandmother   . Diabetes Maternal Grandfather   . Diabetes Paternal Grandmother   . Diabetes  Paternal Grandfather     No Known Allergies  Medication list has been reviewed and updated.  Current Outpatient Prescriptions on File Prior to Visit  Medication Sig Dispense Refill  . albuterol (PROVENTIL HFA;VENTOLIN HFA) 108 (90 Base) MCG/ACT inhaler Inhale 2 puffs into the lungs every 6 (six) hours as needed for wheezing or shortness of breath. (Patient not taking: Reported on 08/01/2016) 1 Inhaler 0  . citalopram (CELEXA) 20 MG tablet TAKE 1 TABLET(20 MG) BY MOUTH DAILY 90 tablet 3  . levothyroxine (SYNTHROID, LEVOTHROID) 100 MCG tablet Take 1 tablet (100 mcg total) by mouth daily. 90 tablet 3  . lisinopril (PRINIVIL,ZESTRIL) 5 MG tablet TAKE 1 TABLET(5 MG) BY MOUTH DAILY 90 tablet 3  . loratadine-pseudoephedrine (CLARITIN-D 24 HOUR) 10-240 MG per 24 hr tablet Take 1 tablet by mouth daily.      . montelukast (SINGULAIR) 10 MG tablet Take 1 tablet (10 mg total) by mouth at bedtime. 30 tablet 6  . Multiple Vitamin (MULTIVITAMIN) tablet Take 1 tablet by mouth daily.    Marland Kitchen omeprazole (PRILOSEC) 20 MG capsule Take 20 mg by mouth daily.     No current facility-administered medications on file prior to visit.     Review of Systems:  As per HPI- otherwise negative. No fever or chills  Physical Examination: There were no vitals filed for this visit. There were no vitals filed for  this visit. There is no height or weight on file to calculate BMI. Ideal Body Weight:   Temp 98.4 96% o2 sat 120/82 Pulse 67  GEN: WDWN, NAD, Non-toxic, Alert & Oriented x 3, obese, otherwise looks well HEENT: Atraumatic, Normocephalic.  Ears and Nose: No external deformity. EXTR: No clubbing/cyanosis/edema NEURO: Normal gait.  PSYCH: Normally interactive. Conversant. Not depressed or anxious appearing.  Calm demeanor.  She has an irregularly shaped nevus with an irregular border and some pigment variation on her mid upper back.  It is approx 47mm in diameter at the widest part VC obtained.  Prepped with  betadine and numbed with 23ml of 1% lido.   Sterile technique employed.  Obtained 26mm punch bx of majority of lesion- specimen to path Applied dressing- steri strips to more closely approximate bunch borders, then [employer] dressing Pt tolerated well   Assessment and Plan: Nevus  Punch bx as above Discussed wound care- leave bandage in place 24 hours, then may shower, leave steris but change outer bandage as needed Watch for any sign of infection I will be in touch with path report  Signed Lamar Blinks, MD

## 2016-08-26 ENCOUNTER — Encounter: Payer: Self-pay | Admitting: Family Medicine

## 2016-08-29 ENCOUNTER — Encounter: Payer: Self-pay | Admitting: Family Medicine

## 2016-08-29 ENCOUNTER — Other Ambulatory Visit: Payer: Self-pay | Admitting: Family Medicine

## 2016-08-29 DIAGNOSIS — D235 Other benign neoplasm of skin of trunk: Secondary | ICD-10-CM

## 2016-08-29 NOTE — Progress Notes (Signed)
Called pt and LMOM; I am going to set her up to see derm for a wider excision of her recent skin lesion.  Please let me know if any questions

## 2016-09-03 ENCOUNTER — Encounter: Payer: Self-pay | Admitting: Family Medicine

## 2016-09-22 ENCOUNTER — Encounter: Payer: Self-pay | Admitting: Family Medicine

## 2016-09-29 ENCOUNTER — Other Ambulatory Visit: Payer: Self-pay | Admitting: Family Medicine

## 2016-09-29 DIAGNOSIS — R062 Wheezing: Secondary | ICD-10-CM

## 2016-11-24 ENCOUNTER — Other Ambulatory Visit: Payer: Self-pay | Admitting: Family Medicine

## 2016-11-24 DIAGNOSIS — R062 Wheezing: Secondary | ICD-10-CM

## 2016-11-25 ENCOUNTER — Other Ambulatory Visit: Payer: Self-pay | Admitting: Family Medicine

## 2016-11-25 DIAGNOSIS — R062 Wheezing: Secondary | ICD-10-CM

## 2016-12-23 ENCOUNTER — Other Ambulatory Visit: Payer: Self-pay | Admitting: Family Medicine

## 2016-12-23 DIAGNOSIS — R062 Wheezing: Secondary | ICD-10-CM

## 2017-07-12 ENCOUNTER — Other Ambulatory Visit: Payer: Self-pay | Admitting: Family Medicine

## 2017-07-12 DIAGNOSIS — E039 Hypothyroidism, unspecified: Secondary | ICD-10-CM

## 2017-08-18 ENCOUNTER — Encounter: Payer: Self-pay | Admitting: Family Medicine

## 2017-08-18 ENCOUNTER — Ambulatory Visit (INDEPENDENT_AMBULATORY_CARE_PROVIDER_SITE_OTHER): Payer: BC Managed Care – PPO | Admitting: Family Medicine

## 2017-08-18 ENCOUNTER — Other Ambulatory Visit (HOSPITAL_COMMUNITY)
Admission: RE | Admit: 2017-08-18 | Discharge: 2017-08-18 | Disposition: A | Discharge status: Home / Self Care | Payer: BC Managed Care – PPO | Source: Ambulatory Visit | Attending: Family Medicine | Admitting: Family Medicine

## 2017-08-18 VITALS — BP 137/71 | HR 79 | Temp 98.4°F | Resp 16 | Ht 66.0 in | Wt 305.0 lb

## 2017-08-18 DIAGNOSIS — E119 Type 2 diabetes mellitus without complications: Secondary | ICD-10-CM | POA: Insufficient documentation

## 2017-08-18 DIAGNOSIS — Z130 Encounter for screening for diseases of the blood and blood-forming organs and certain disorders involving the immune mechanism: Secondary | ICD-10-CM

## 2017-08-18 DIAGNOSIS — F419 Anxiety disorder, unspecified: Secondary | ICD-10-CM | POA: Diagnosis not present

## 2017-08-18 DIAGNOSIS — E785 Hyperlipidemia, unspecified: Secondary | ICD-10-CM | POA: Diagnosis not present

## 2017-08-18 DIAGNOSIS — E039 Hypothyroidism, unspecified: Secondary | ICD-10-CM | POA: Insufficient documentation

## 2017-08-18 DIAGNOSIS — Z1239 Encounter for other screening for malignant neoplasm of breast: Secondary | ICD-10-CM

## 2017-08-18 DIAGNOSIS — E1169 Type 2 diabetes mellitus with other specified complication: Secondary | ICD-10-CM | POA: Diagnosis not present

## 2017-08-18 DIAGNOSIS — Z0000 Encounter for general adult medical examination without abnormal findings: Secondary | ICD-10-CM | POA: Insufficient documentation

## 2017-08-18 DIAGNOSIS — Z79899 Other long term (current) drug therapy: Secondary | ICD-10-CM | POA: Diagnosis not present

## 2017-08-18 DIAGNOSIS — Z79890 Hormone replacement therapy (postmenopausal): Secondary | ICD-10-CM | POA: Insufficient documentation

## 2017-08-18 DIAGNOSIS — Z124 Encounter for screening for malignant neoplasm of cervix: Secondary | ICD-10-CM

## 2017-08-18 DIAGNOSIS — F411 Generalized anxiety disorder: Secondary | ICD-10-CM

## 2017-08-18 DIAGNOSIS — Z1231 Encounter for screening mammogram for malignant neoplasm of breast: Secondary | ICD-10-CM

## 2017-08-18 DIAGNOSIS — Z6825 Body mass index (BMI) 25.0-25.9, adult: Secondary | ICD-10-CM | POA: Diagnosis not present

## 2017-08-18 DIAGNOSIS — R062 Wheezing: Secondary | ICD-10-CM

## 2017-08-18 DIAGNOSIS — K219 Gastro-esophageal reflux disease without esophagitis: Secondary | ICD-10-CM | POA: Diagnosis not present

## 2017-08-18 LAB — COMPREHENSIVE METABOLIC PANEL
ALBUMIN: 4 g/dL (ref 3.5–5.2)
ALT: 12 U/L (ref 0–35)
AST: 16 U/L (ref 0–37)
Alkaline Phosphatase: 69 U/L (ref 39–117)
BILIRUBIN TOTAL: 0.8 mg/dL (ref 0.2–1.2)
BUN: 18 mg/dL (ref 6–23)
CALCIUM: 9.5 mg/dL (ref 8.4–10.5)
CO2: 30 meq/L (ref 19–32)
CREATININE: 0.79 mg/dL (ref 0.40–1.20)
Chloride: 101 mEq/L (ref 96–112)
GFR: 82.17 mL/min (ref >60.00)
Glucose, Bld: 125 mg/dL — ABNORMAL HIGH (ref 70–99)
Potassium: 4.6 mEq/L (ref 3.5–5.1)
Sodium: 138 mEq/L (ref 135–145)
Total Protein: 6.9 g/dL (ref 6.0–8.3)

## 2017-08-18 LAB — CBC
HCT: 39.8 % (ref 36.0–46.0)
Hemoglobin: 13.4 g/dL (ref 12.0–15.0)
MCHC: 33.6 g/dL (ref 30.0–36.0)
MCV: 86.2 fl (ref 78.0–100.0)
PLATELETS: 253 10*3/uL (ref 150.0–400.0)
RBC: 4.62 Mil/uL (ref 3.87–5.11)
RDW: 13.3 % (ref 11.5–15.5)
WBC: 8.3 10*3/uL (ref 4.0–10.5)

## 2017-08-18 LAB — LIPID PANEL
CHOL/HDL RATIO: 5
Cholesterol: 200 mg/dL (ref 0–200)
HDL: 44.2 mg/dL (ref >39.00)
LDL Cholesterol: 121 mg/dL — ABNORMAL HIGH (ref 0–99)
NONHDL: 156
Triglycerides: 177 mg/dL — ABNORMAL HIGH (ref 0.0–149.0)
VLDL: 35.4 mg/dL (ref 0.0–40.0)

## 2017-08-18 LAB — TSH: TSH: 1.86 u[IU]/mL (ref 0.35–4.50)

## 2017-08-18 LAB — HEMOGLOBIN A1C: HEMOGLOBIN A1C: 6.9 % — AB (ref 4.6–6.5)

## 2017-08-18 MED ORDER — LEVOTHYROXINE SODIUM 100 MCG PO TABS: ORAL_TABLET | ORAL | 3 refills | Status: DC

## 2017-08-18 MED ORDER — CITALOPRAM HYDROBROMIDE 20 MG PO TABS: ORAL_TABLET | ORAL | 3 refills | Status: DC

## 2017-08-18 MED ORDER — MONTELUKAST SODIUM 10 MG PO TABS: 10.0000 mg | ORAL_TABLET | Freq: Every day | ORAL | 3 refills | Status: DC

## 2017-08-18 MED ORDER — LISINOPRIL 5 MG PO TABS
ORAL_TABLET | ORAL | 3 refills | Status: DC
Start: 2017-08-18 — End: 2018-08-25

## 2017-08-18 NOTE — Patient Instructions (Addendum)
It was good to see you today!  We will get your labs to you asap I refilled your medications- however wait to fill your thyroid med until you hear from me in case we need to make any adjustment.   Please do get your annual eye exam when due   Health Maintenance, Female Adopting a healthy lifestyle and getting preventive care can go a long way to promote health and wellness. Talk with your health care provider about what schedule of regular examinations is right for you. This is a good chance for you to check in with your provider about disease prevention and staying healthy. In between checkups, there are plenty of things you can do on your own. Experts have done a lot of research about which lifestyle changes and preventive measures are most likely to keep you healthy. Ask your health care provider for more information. Weight and diet Eat a healthy diet  Be sure to include plenty of vegetables, fruits, low-fat dairy products, and lean protein.  Do not eat a lot of foods high in solid fats, added sugars, or salt.  Get regular exercise. This is one of the most important things you can do for your health. ? Most adults should exercise for at least 150 minutes each week. The exercise should increase your heart rate and make you sweat (moderate-intensity exercise). ? Most adults should also do strengthening exercises at least twice a week. This is in addition to the moderate-intensity exercise.  Maintain a healthy weight  Body mass index (BMI) is a measurement that can be used to identify possible weight problems. It estimates body fat based on height and weight. Your health care provider can help determine your BMI and help you achieve or maintain a healthy weight.  For females 101 years of age and older: ? A BMI below 18.5 is considered underweight. ? A BMI of 18.5 to 24.9 is normal. ? A BMI of 25 to 29.9 is considered overweight. ? A BMI of 30 and above is considered obese.  Watch levels  of cholesterol and blood lipids  You should start having your blood tested for lipids and cholesterol at 49 years of age, then have this test every 5 years.  You may need to have your cholesterol levels checked more often if: ? Your lipid or cholesterol levels are high. ? You are older than 49 years of age. ? You are at high risk for heart disease.  Cancer screening Lung Cancer  Lung cancer screening is recommended for adults 38-57 years old who are at high risk for lung cancer because of a history of smoking.  A yearly low-dose CT scan of the lungs is recommended for people who: ? Currently smoke. ? Have quit within the past 15 years. ? Have at least a 30-pack-year history of smoking. A pack year is smoking an average of one pack of cigarettes a day for 1 year.  Yearly screening should continue until it has been 15 years since you quit.  Yearly screening should stop if you develop a health problem that would prevent you from having lung cancer treatment.  Breast Cancer  Practice breast self-awareness. This means understanding how your breasts normally appear and feel.  It also means doing regular breast self-exams. Let your health care provider know about any changes, no matter how small.  If you are in your 20s or 30s, you should have a clinical breast exam (CBE) by a health care provider every 1-3 years as  part of a regular health exam.  If you are 40 or older, have a CBE every year. Also consider having a breast X-ray (mammogram) every year.  If you have a family history of breast cancer, talk to your health care provider about genetic screening.  If you are at high risk for breast cancer, talk to your health care provider about having an MRI and a mammogram every year.  Breast cancer gene (BRCA) assessment is recommended for women who have family members with BRCA-related cancers. BRCA-related cancers include: ? Breast. ? Ovarian. ? Tubal. ? Peritoneal  cancers.  Results of the assessment will determine the need for genetic counseling and BRCA1 and BRCA2 testing.  Cervical Cancer Your health care provider may recommend that you be screened regularly for cancer of the pelvic organs (ovaries, uterus, and vagina). This screening involves a pelvic examination, including checking for microscopic changes to the surface of your cervix (Pap test). You may be encouraged to have this screening done every 3 years, beginning at age 75.  For women ages 51-65, health care providers may recommend pelvic exams and Pap testing every 3 years, or they may recommend the Pap and pelvic exam, combined with testing for human papilloma virus (HPV), every 5 years. Some types of HPV increase your risk of cervical cancer. Testing for HPV may also be done on women of any age with unclear Pap test results.  Other health care providers may not recommend any screening for nonpregnant women who are considered low risk for pelvic cancer and who do not have symptoms. Ask your health care provider if a screening pelvic exam is right for you.  If you have had past treatment for cervical cancer or a condition that could lead to cancer, you need Pap tests and screening for cancer for at least 20 years after your treatment. If Pap tests have been discontinued, your risk factors (such as having a new sexual partner) need to be reassessed to determine if screening should resume. Some women have medical problems that increase the chance of getting cervical cancer. In these cases, your health care provider may recommend more frequent screening and Pap tests.  Colorectal Cancer  This type of cancer can be detected and often prevented.  Routine colorectal cancer screening usually begins at 49 years of age and continues through 49 years of age.  Your health care provider may recommend screening at an earlier age if you have risk factors for colon cancer.  Your health care provider may also  recommend using home test kits to check for hidden blood in the stool.  A small camera at the end of a tube can be used to examine your colon directly (sigmoidoscopy or colonoscopy). This is done to check for the earliest forms of colorectal cancer.  Routine screening usually begins at age 76.  Direct examination of the colon should be repeated every 5-10 years through 49 years of age. However, you may need to be screened more often if early forms of precancerous polyps or small growths are found.  Skin Cancer  Check your skin from head to toe regularly.  Tell your health care provider about any new moles or changes in moles, especially if there is a change in a mole's shape or color.  Also tell your health care provider if you have a mole that is larger than the size of a pencil eraser.  Always use sunscreen. Apply sunscreen liberally and repeatedly throughout the day.  Protect yourself by wearing  long sleeves, pants, a wide-brimmed hat, and sunglasses whenever you are outside.  Heart disease, diabetes, and high blood pressure  High blood pressure causes heart disease and increases the risk of stroke. High blood pressure is more likely to develop in: ? People who have blood pressure in the high end of the normal range (130-139/85-89 mm Hg). ? People who are overweight or obese. ? People who are African American.  If you are 85-44 years of age, have your blood pressure checked every 3-5 years. If you are 74 years of age or older, have your blood pressure checked every year. You should have your blood pressure measured twice-once when you are at a hospital or clinic, and once when you are not at a hospital or clinic. Record the average of the two measurements. To check your blood pressure when you are not at a hospital or clinic, you can use: ? An automated blood pressure machine at a pharmacy. ? A home blood pressure monitor.  If you are between 24 years and 41 years old, ask your  health care provider if you should take aspirin to prevent strokes.  Have regular diabetes screenings. This involves taking a blood sample to check your fasting blood sugar level. ? If you are at a normal weight and have a low risk for diabetes, have this test once every three years after 49 years of age. ? If you are overweight and have a high risk for diabetes, consider being tested at a younger age or more often. Preventing infection Hepatitis B  If you have a higher risk for hepatitis B, you should be screened for this virus. You are considered at high risk for hepatitis B if: ? You were born in a country where hepatitis B is common. Ask your health care provider which countries are considered high risk. ? Your parents were born in a high-risk country, and you have not been immunized against hepatitis B (hepatitis B vaccine). ? You have HIV or AIDS. ? You use needles to inject street drugs. ? You live with someone who has hepatitis B. ? You have had sex with someone who has hepatitis B. ? You get hemodialysis treatment. ? You take certain medicines for conditions, including cancer, organ transplantation, and autoimmune conditions.  Hepatitis C  Blood testing is recommended for: ? Everyone born from [birth year] through 1967. ? Anyone with known risk factors for hepatitis C.  Sexually transmitted infections (STIs)  You should be screened for sexually transmitted infections (STIs) including gonorrhea and chlamydia if: ? You are sexually active and are younger than 49 years of age. ? You are older than 49 years of age and your health care provider tells you that you are at risk for this type of infection. ? Your sexual activity has changed since you were last screened and you are at an increased risk for chlamydia or gonorrhea. Ask your health care provider if you are at risk.  If you do not have HIV, but are at risk, it may be recommended that you take a prescription medicine daily to  prevent HIV infection. This is called pre-exposure prophylaxis (PrEP). You are considered at risk if: ? You are sexually active and do not regularly use condoms or know the HIV status of your partner(s). ? You take drugs by injection. ? You are sexually active with a partner who has HIV.  Talk with your health care provider about whether you are at high risk of being infected with HIV.  If you choose to begin PrEP, you should first be tested for HIV. You should then be tested every 3 months for as long as you are taking PrEP. Pregnancy  If you are premenopausal and you may become pregnant, ask your health care provider about preconception counseling.  If you may become pregnant, take 400 to 800 micrograms (mcg) of folic acid every day.  If you want to prevent pregnancy, talk to your health care provider about birth control (contraception). Osteoporosis and menopause  Osteoporosis is a disease in which the bones lose minerals and strength with aging. This can result in serious bone fractures. Your risk for osteoporosis can be identified using a bone density scan.  If you are 19 years of age or older, or if you are at risk for osteoporosis and fractures, ask your health care provider if you should be screened.  Ask your health care provider whether you should take a calcium or vitamin D supplement to lower your risk for osteoporosis.  Menopause may have certain physical symptoms and risks.  Hormone replacement therapy may reduce some of these symptoms and risks. Talk to your health care provider about whether hormone replacement therapy is right for you. Follow these instructions at home:  Schedule regular health, dental, and eye exams.  Stay current with your immunizations.  Do not use any tobacco products including cigarettes, chewing tobacco, or electronic cigarettes.  If you are pregnant, do not drink alcohol.  If you are breastfeeding, limit how much and how often you drink  alcohol.  Limit alcohol intake to no more than 1 drink per day for nonpregnant women. One drink equals 12 ounces of beer, 5 ounces of wine, or 1 ounces of hard liquor.  Do not use street drugs.  Do not share needles.  Ask your health care provider for help if you need support or information about quitting drugs.  Tell your health care provider if you often feel depressed.  Tell your health care provider if you have ever been abused or do not feel safe at home. This information is not intended to replace advice given to you by your health care provider. Make sure you discuss any questions you have with your health care provider. Document Released: 07/16/2010 Document Revised: 06/08/2015 Document Reviewed: 10/04/2014 Elsevier Interactive Patient Education  [employer].

## 2017-08-18 NOTE — Progress Notes (Addendum)
Lisa Patel at [facility] [address] [place], [place] [address] [phone]  Date:  08/18/2017   Name:  Lisa Patel   DOB:  [birth year]   MRN:  [id number]  PCP:  Darreld Mclean, MD    Chief Complaint: No chief complaint on file.   History of Present Illness:  Lisa Patel is a 47 y.o. very pleasant female patient who presents with the following:  Here today for a CPE and pap- last seen here a year ago  History of DM, hyperlipidemia Last pap in 2016- would like to repeat today Labs a year ago- she is fasting and is very hungry right now  Mammo: a year ago - is scheduled for today already   She is doing about the same-  Her children are well- they are 73 and 82 yo No grands as of yet   Eye exam- last year, she will do this soon  Pt notes that she has made some efforts to lose weight with diet and exercise - recnetly she was very strict with diet and exercise for a good 5 weeks.  She is really getting frustrated. She cannot seem to make her scale budge at all.   We discussed options for her today. She is interested in bariatric surgery but is nervous about possible complications.  We also discussed the healthy weight and wellness center as an option for her  She has struggled with this during her "whole life"   Her menses are still regular- she would welcome menopause   No CP or SOB No GI changes   celexa 20 Synthroid 100 Lisinopril 5 singulair Patient Active Problem List   Diagnosis Date Noted  . Knee injury 12/28/2013  . Encounter for routine gynecological examination 09/11/2011  . Numbness of foot 09/11/2011  . Hypothyroid 09/11/2011  . Vaginitis 01/09/2011  . Screening for malignant neoplasm of cervix 04/23/2010  . Diet-controlled diabetes mellitus ([place]) 10/02/2009  . Hyperlipidemia associated with type 2 diabetes mellitus ([place]) 10/02/2009  . ANXIETY STATE, UNSPECIFIED 09/04/2009  . PARESTHESIA  09/04/2009  . SNORING 09/04/2009  . VITAMIN D DEFICIENCY 08/31/2008  . HYPERKALEMIA 08/31/2008  . GERD 08/31/2008  . IMPAIRED FASTING GLUCOSE 08/31/2008    Past Medical History:  Diagnosis Date  . Diabetes mellitus   . GERD (gastroesophageal reflux disease)   . Hyperlipidemia   . Obesity   . Panic attack   . Thyroid disease   . Vitamin D deficiency     No past surgical history on file.  Social History   Tobacco Use  . Smoking status: Never Smoker  . Smokeless tobacco: Never Used  Substance Use Topics  . Alcohol use: Not on file  . Drug use: Not on file    Family History  Problem Relation Age of Onset  . Diabetes Maternal Grandmother   . Diabetes Maternal Grandfather   . Diabetes Paternal Grandmother   . Diabetes Paternal Grandfather     No Known Allergies  Medication list has been reviewed and updated.  Current Outpatient Medications on File Prior to Visit  Medication Sig Dispense Refill  . albuterol (PROVENTIL HFA;VENTOLIN HFA) 108 (90 Base) MCG/ACT inhaler Inhale 2 puffs into the lungs every 6 (six) hours as needed for wheezing or shortness of breath. 1 Inhaler 0  . citalopram (CELEXA) 20 MG tablet TAKE 1 TABLET(20 MG) BY MOUTH DAILY 90 tablet 3  . levothyroxine (SYNTHROID, LEVOTHROID) 100 MCG tablet TAKE 1  TABLET(100 MCG) BY MOUTH DAILY 30 tablet 0  . lisinopril (PRINIVIL,ZESTRIL) 5 MG tablet TAKE 1 TABLET(5 MG) BY MOUTH DAILY 90 tablet 3  . loratadine-pseudoephedrine (CLARITIN-D 24 HOUR) 10-240 MG per 24 hr tablet Take 1 tablet by mouth daily.      . montelukast (SINGULAIR) 10 MG tablet Take 1 tablet (10 mg total) by mouth at bedtime. 30 tablet 5  . Multiple Vitamin (MULTIVITAMIN) tablet Take 1 tablet by mouth daily.    Marland Kitchen omeprazole (PRILOSEC) 20 MG capsule Take 20 mg by mouth daily.     No current facility-administered medications on file prior to visit.     Review of Systems:  As per HPI- otherwise negative. No CP or SOB Mood is ok  Physical  Examination: Vitals:   08/18/17 1234  BP: 137/71  Pulse: 79  Resp: 16  Temp: 98.4 F (36.9 C)  SpO2: 98%   Vitals:   08/18/17 1234  Weight: (!) 305 lb (138.3 kg)  Height: 5\' 6"  (1.676 m)   Body mass index is 49.23 kg/m. Ideal Body Weight: Weight in (lb) to have BMI = 25: 154.6  GEN: WDWN, NAD, Non-toxic, A & O x 3, obese, otherwise looks well  HEENT: Atraumatic, Normocephalic. Neck supple. No masses, No LAD.  Bilateral TM wnl, oropharynx normal.  PEERL,EOMI.   Ears and Nose: No external deformity. CV: RRR, No M/G/R. No JVD. No thrill. No extra heart sounds. PULM: CTA B, no wheezes, crackles, rhonchi. No retractions. No resp. distress. No accessory muscle use. ABD: S, NT, ND, +BS. No rebound. No HSM. EXTR: No c/c/e NEURO Normal gait.  PSYCH: Normally interactive. Conversant. Not depressed or anxious appearing.  Calm demeanor.  Breast: normal exam, no masses/ dimpling/ discharge Pelvic: normal, no vaginal lesions or discharge. Uterus normal, no CMT, no adnexal tendereness or masses    Assessment and Plan: Physical exam  Diet-controlled diabetes mellitus ([place]) - Plan: Comprehensive metabolic panel, Hemoglobin A1c, Microalbumin / creatinine urine ratio, lisinopril (PRINIVIL,ZESTRIL) 5 MG tablet  Hyperlipidemia associated with type 2 diabetes mellitus ([place]) - Plan: Lipid panel  Hypothyroidism (acquired) - Plan: TSH, levothyroxine (SYNTHROID, LEVOTHROID) 100 MCG tablet  Screening for deficiency anemia - Plan: CBC  Anxiety state - Plan: citalopram (CELEXA) 20 MG tablet  Expiratory wheezing - Plan: montelukast (SINGULAIR) 10 MG tablet  Screening for cervical cancer - Plan: Cytology - PAP  Screening for breast cancer  Morbid obesity, unspecified obesity type ([place])  CPE today Labs pending as above Diet controlled DM Refilled meds for her today Discussed weight loss options - she will look into the bariatrics seminar, and also gave hand out for the weight and wellness  center.   Will plan further follow- up pending labs.  Signed Lamar Blinks, MD Received her labs- Results for orders placed or performed in visit on 08/18/17  CBC  Result Value Ref Range   WBC 8.3 4.0 - 10.5 K/uL   RBC 4.62 3.87 - 5.11 Mil/uL   Platelets 253.0 150.0 - 400.0 K/uL   Hemoglobin 13.4 12.0 - 15.0 g/dL   HCT 39.8 36.0 - 46.0 %   MCV 86.2 78.0 - 100.0 fl   MCHC 33.6 30.0 - 36.0 g/dL   RDW 13.3 11.5 - 15.5 %  Comprehensive metabolic panel  Result Value Ref Range   Sodium 138 135 - 145 mEq/L   Potassium 4.6 3.5 - 5.1 mEq/L   Chloride 101 96 - 112 mEq/L   CO2 30 19 - 32 mEq/L   Glucose,  Bld 125 (H) 70 - 99 mg/dL   BUN 18 6 - 23 mg/dL   Creatinine, Ser 0.79 0.40 - 1.20 mg/dL   Total Bilirubin 0.8 0.2 - 1.2 mg/dL   Alkaline Phosphatase 69 39 - 117 U/L   AST 16 0 - 37 U/L   ALT 12 0 - 35 U/L   Total Protein 6.9 6.0 - 8.3 g/dL   Albumin 4.0 3.5 - 5.2 g/dL   Calcium 9.5 8.4 - 10.5 mg/dL   GFR 82.17 >60.00 mL/min  Hemoglobin A1c  Result Value Ref Range   Hgb A1c MFr Bld 6.9 (H) 4.6 - 6.5 %  Lipid panel  Result Value Ref Range   Cholesterol 200 0 - 200 mg/dL   Triglycerides 177.0 (H) 0.0 - 149.0 mg/dL   HDL 44.20 >39.00 mg/dL   VLDL 35.4 0.0 - 40.0 mg/dL   LDL Cholesterol 121 (H) 0 - 99 mg/dL   Total CHOL/HDL Ratio 5    NonHDL 156.00   TSH  Result Value Ref Range   TSH 1.86 0.35 - 4.50 uIU/mL   Message to pt   Your blood counts are normal Metabolic profile is normal except for slightly high glucose consistent with diabetes Your A1c has gone up a bit. At this time I would recommend that we start you on an oral medication to help bring your blood sugar down, specifically metformin.   I would also recommend that we start a cholesterol medication for you- as a diabetic you are at higher risk of cardiovascular disease, so we want to be more proactive in controlling your cholesterol  Thyroid is ok! Let me know if you are ok with my sending in an rx for metformin  and for a cholesterol medication Let's plan to recheck in about 4 months

## 2017-08-19 LAB — MICROALBUMIN / CREATININE URINE RATIO
Creatinine,U: 199.2 mg/dL
MICROALB UR: 0.9 mg/dL (ref 0.0–1.9)
Microalb Creat Ratio: 0.5 mg/g (ref 0.0–30.0)

## 2017-08-19 MED ORDER — METFORMIN HCL 500 MG PO TABS: 500.0000 mg | ORAL_TABLET | Freq: Every day | ORAL | 3 refills | Status: DC

## 2017-08-19 MED ORDER — SIMVASTATIN 20 MG PO TABS
20.0000 mg | ORAL_TABLET | Freq: Every day | ORAL | 3 refills | Status: DC
Start: 2017-08-19 — End: 2018-08-25

## 2017-08-20 ENCOUNTER — Other Ambulatory Visit: Payer: Self-pay | Admitting: Family Medicine

## 2017-08-20 ENCOUNTER — Encounter: Payer: Self-pay | Admitting: Family Medicine

## 2017-08-20 DIAGNOSIS — E039 Hypothyroidism, unspecified: Secondary | ICD-10-CM

## 2017-08-20 LAB — CYTOLOGY - PAP
DIAGNOSIS: NEGATIVE
HPV (WINDOPATH): NOT DETECTED

## 2017-10-14 ENCOUNTER — Ambulatory Visit (INDEPENDENT_AMBULATORY_CARE_PROVIDER_SITE_OTHER): Payer: BC Managed Care – PPO

## 2017-10-14 ENCOUNTER — Ambulatory Visit: Payer: BC Managed Care – PPO | Admitting: Nurse Practitioner

## 2017-10-14 ENCOUNTER — Encounter: Payer: Self-pay | Admitting: Nurse Practitioner

## 2017-10-14 ENCOUNTER — Ambulatory Visit: Payer: Self-pay | Admitting: *Deleted

## 2017-10-14 VITALS — BP 120/76 | HR 88 | Temp 98.3°F | Ht 66.0 in | Wt 310.0 lb

## 2017-10-14 DIAGNOSIS — S8991XA Unspecified injury of right lower leg, initial encounter: Secondary | ICD-10-CM | POA: Diagnosis not present

## 2017-10-14 DIAGNOSIS — M25561 Pain in right knee: Secondary | ICD-10-CM

## 2017-10-14 DIAGNOSIS — M25461 Effusion, right knee: Secondary | ICD-10-CM

## 2017-10-14 NOTE — Telephone Encounter (Signed)
Opened in error

## 2017-10-14 NOTE — Patient Instructions (Addendum)
May use knee sleeve during day and off at night. May also use cold compress as needed.  Normal knee x-ray. Use cold compress and knee sleeve as discussed. F/up with pcp if no improvement in 1-2weeks   Knee Pain, Adult Many things can cause knee pain. The pain often goes away on its own with time and rest. If the pain does not go away, tests may be done to find out what is causing the pain. Follow these instructions at home: Activity  Rest your knee.  Do not do things that cause pain.  Avoid activities where both feet leave the ground at the same time (high-impact activities). Examples are running, jumping rope, and doing jumping jacks. General instructions  Take medicines only as told by your doctor.  Raise (elevate) your knee when you are resting. Make sure your knee is higher than your heart.  Sleep with a pillow under your knee.  If told, put ice on the knee: ? Put ice in a plastic bag. ? Place a towel between your skin and the bag. ? Leave the ice on for 20 minutes, 2-3 times a day.  Ask your doctor if you should wear an elastic knee support.  Lose weight if you are overweight. Being overweight can make your knee hurt more.  Do not use any tobacco products. These include cigarettes, chewing tobacco, or electronic cigarettes. If you need help quitting, ask your doctor. Smoking may slow down healing. Contact a doctor if:  The pain does not stop.  The pain changes or gets worse.  You have a fever along with knee pain.  Your knee gives out or locks up.  Your knee swells, and becomes worse. Get help right away if:  Your knee feels warm.  You cannot move your knee.  You have very bad knee pain.  You have chest pain.  You have trouble breathing. Summary  Many things can cause knee pain. The pain often goes away on its own with time and rest.  Avoid activities that put stress on your knee. These include running and jumping rope.  Get help right away if you  cannot move your knee, or if your knee feels warm, or if you have trouble breathing. This information is not intended to replace advice given to you by your health care provider. Make sure you discuss any questions you have with your health care provider. Document Released: 03/29/2008 Document Revised: 12/26/2015 Document Reviewed: 12/26/2015 Elsevier Interactive Patient Education  2017 [employer].

## 2017-10-14 NOTE — Progress Notes (Signed)
Subjective:  Patient ID: Lisa Patel, female    DOB: Dec 30, 1970  Age: 47 y.o. MRN: [id number]  CC: Knee Pain (patient is complaining of right knee pain,red. this started 3 days ago but pt fail 3 wk ago. )  Knee Pain   Incident onset: 3weeks ago. The incident occurred at work. The injury mechanism was a fall. The pain is present in the right knee. The quality of the pain is described as aching and burning. The pain has been constant since onset. Pertinent negatives include no inability to bear weight, loss of motion, loss of sensation, muscle weakness, numbness or tingling. She reports no foreign bodies present. The symptoms are aggravated by movement, palpation and weight bearing. She has tried rest for the symptoms. The treatment provided mild relief.  fell directly on knees 3weeks ago, had bruise on right lateral knee, but it resolved  Reviewed past Medical, Social and Family history today.  Outpatient Medications Prior to Visit  Medication Sig Dispense Refill  . albuterol (PROVENTIL HFA;VENTOLIN HFA) 108 (90 Base) MCG/ACT inhaler Inhale 2 puffs into the lungs every 6 (six) hours as needed for wheezing or shortness of breath. 1 Inhaler 0  . citalopram (CELEXA) 20 MG tablet TAKE 1 TABLET(20 MG) BY MOUTH DAILY 90 tablet 3  . Ibuprofen (ADVIL PO) Take by mouth.    . levothyroxine (SYNTHROID, LEVOTHROID) 100 MCG tablet TAKE 1 TABLET(100 MCG) BY MOUTH DAILY 90 tablet 3  . lisinopril (PRINIVIL,ZESTRIL) 5 MG tablet TAKE 1 TABLET(5 MG) BY MOUTH DAILY 90 tablet 3  . loratadine-pseudoephedrine (CLARITIN-D 24 HOUR) 10-240 MG per 24 hr tablet Take 1 tablet by mouth daily.      . metFORMIN (GLUCOPHAGE) 500 MG tablet Take 1 tablet (500 mg total) by mouth daily. 90 tablet 3  . montelukast (SINGULAIR) 10 MG tablet Take 1 tablet (10 mg total) by mouth at bedtime. 90 tablet 3  . Multiple Vitamin (MULTIVITAMIN) tablet Take 1 tablet by mouth daily.    Marland Kitchen omeprazole (PRILOSEC) 20 MG capsule Take 20 mg by  mouth daily.    . simvastatin (ZOCOR) 20 MG tablet Take 1 tablet (20 mg total) by mouth at bedtime. 90 tablet 3  . levothyroxine (SYNTHROID, LEVOTHROID) 88 MCG tablet TAKE 1 TABLET(88 MCG) BY MOUTH DAILY (Patient not taking: Reported on 10/14/2017) 90 tablet 1   No facility-administered medications prior to visit.     ROS See HPI  Objective:  BP 120/76   Pulse 88   Temp 98.3 F (36.8 C) (Oral)   Ht 5\' 6"  (1.676 m)   Wt (!) 310 lb (140.6 kg)   SpO2 97%   BMI 50.04 kg/m   BP Readings from Last 3 Encounters:  10/14/17 120/76  08/18/17 137/71  08/01/16 112/78    Wt Readings from Last 3 Encounters:  10/14/17 (!) 310 lb (140.6 kg)  08/18/17 (!) 305 lb (138.3 kg)  08/01/16 (!) 302 lb 9.6 oz (137.3 kg)    Physical Exam  Constitutional: She appears well-developed and well-nourished.  Cardiovascular: Normal rate.  Pulmonary/Chest: Effort normal.  Musculoskeletal: She exhibits edema and tenderness. She exhibits no deformity.       Right knee: She exhibits swelling and effusion. She exhibits no ecchymosis and no erythema. Tenderness found. Medial joint line, lateral joint line and patellar tendon tenderness noted.       Right ankle: Normal.       Right lower leg: Normal.       Right foot: Normal.  Vitals reviewed.  Lab Results  Component Value Date   WBC 8.3 08/18/2017   HGB 13.4 08/18/2017   HCT 39.8 08/18/2017   PLT 253.0 08/18/2017   GLUCOSE 125 (H) 08/18/2017   CHOL 200 08/18/2017   TRIG 177.0 (H) 08/18/2017   HDL 44.20 08/18/2017   LDLCALC 121 (H) 08/18/2017   ALT 12 08/18/2017   AST 16 08/18/2017   NA 138 08/18/2017   K 4.6 08/18/2017   CL 101 08/18/2017   CREATININE 0.79 08/18/2017   BUN 18 08/18/2017   CO2 30 08/18/2017   TSH 1.86 08/18/2017   HGBA1C 6.9 (H) 08/18/2017   MICROALBUR 0.9 08/18/2017    Assessment & Plan:   Alonia was seen today for knee pain.  Diagnoses and all orders for this visit:  Pain and swelling of knee, right -     DG Knee  Complete 4 Views Right  Injury of right knee, initial encounter -     DG Knee Complete 4 Views Right   I am having Cortlynn Mcelreath maintain her loratadine-pseudoephedrine, multivitamin, omeprazole, albuterol, citalopram, levothyroxine, lisinopril, montelukast, metFORMIN, simvastatin, levothyroxine, and Ibuprofen (ADVIL PO).  No orders of the defined types were placed in this encounter.   Follow-up: No follow-ups on file.  Wilfred Lacy, NP

## 2017-11-20 ENCOUNTER — Encounter: Payer: Self-pay | Admitting: Family Medicine

## 2017-11-21 ENCOUNTER — Other Ambulatory Visit (HOSPITAL_COMMUNITY)
Admission: RE | Admit: 2017-11-21 | Discharge: 2017-11-21 | Disposition: A | Discharge status: Home / Self Care | Payer: BC Managed Care – PPO | Source: Ambulatory Visit | Attending: Nurse Practitioner | Admitting: Nurse Practitioner

## 2017-11-21 ENCOUNTER — Encounter: Payer: Self-pay | Admitting: Nurse Practitioner

## 2017-11-21 ENCOUNTER — Ambulatory Visit: Payer: BC Managed Care – PPO | Admitting: Nurse Practitioner

## 2017-11-21 VITALS — BP 118/76 | HR 83 | Temp 98.0°F | Ht 66.0 in | Wt 314.0 lb

## 2017-11-21 DIAGNOSIS — L304 Erythema intertrigo: Secondary | ICD-10-CM | POA: Diagnosis not present

## 2017-11-21 DIAGNOSIS — N898 Other specified noninflammatory disorders of vagina: Secondary | ICD-10-CM

## 2017-11-21 MED ORDER — MICONAZOLE NITRATE 2 % EX POWD: CUTANEOUS | 0 refills | Status: DC | PRN

## 2017-11-21 MED ORDER — FLUCONAZOLE 150 MG PO TABS: 150.0000 mg | ORAL_TABLET | Freq: Every day | ORAL | 0 refills | Status: DC

## 2017-11-21 NOTE — Progress Notes (Signed)
Subjective:  Patient ID: Lisa Patel, female    DOB: 1970-12-09  Age: 47 y.o. MRN: [id number]  CC: Rash (pt is c/o of rash in between butt cheek,painful,red,open area. going on 2 wks. tried cream and tried to keep it dry. )   Rash  This is a new problem. The current episode started 1 to 4 weeks ago. The problem has been gradually worsening since onset. The affected locations include the groin. The rash is characterized by itchiness, dryness, redness and scaling. It is unknown if there was an exposure to a precipitant. Pertinent negatives include no diarrhea, fever or joint pain. (Chronic vaginal discharge and itching) Past treatments include anti-itch cream. The treatment provided no relief.  Vaginal Discharge  The patient's primary symptoms include genital itching, a genital rash and vaginal discharge. The patient's pertinent negatives include no genital lesions, genital odor, missed menses, pelvic pain or vaginal bleeding. This is a recurrent problem. The current episode started more than 1 month ago. The problem occurs constantly. The problem has been unchanged. Associated symptoms include rash. Pertinent negatives include no chills, constipation, diarrhea, dysuria, fever, flank pain, frequency, joint pain, painful intercourse or urgency. The vaginal discharge was white and milky. There has been no bleeding. She has tried nothing for the symptoms. She is sexually active. No, her partner does not have an STD. She uses tubal ligation for contraception. Her menstrual history has been regular. There is no history of PID or an STD.    Reviewed past Medical, Social and Family history today.  Outpatient Medications Prior to Visit  Medication Sig Dispense Refill  . albuterol (PROVENTIL HFA;VENTOLIN HFA) 108 (90 Base) MCG/ACT inhaler Inhale 2 puffs into the lungs every 6 (six) hours as needed for wheezing or shortness of breath. 1 Inhaler 0  . citalopram (CELEXA) 20 MG tablet TAKE 1 TABLET(20 MG) BY  MOUTH DAILY 90 tablet 3  . Ibuprofen (ADVIL PO) Take by mouth.    . levothyroxine (SYNTHROID, LEVOTHROID) 100 MCG tablet TAKE 1 TABLET(100 MCG) BY MOUTH DAILY 90 tablet 3  . levothyroxine (SYNTHROID, LEVOTHROID) 88 MCG tablet TAKE 1 TABLET(88 MCG) BY MOUTH DAILY 90 tablet 1  . lisinopril (PRINIVIL,ZESTRIL) 5 MG tablet TAKE 1 TABLET(5 MG) BY MOUTH DAILY 90 tablet 3  . loratadine-pseudoephedrine (CLARITIN-D 24 HOUR) 10-240 MG per 24 hr tablet Take 1 tablet by mouth daily.      . metFORMIN (GLUCOPHAGE) 500 MG tablet Take 1 tablet (500 mg total) by mouth daily. 90 tablet 3  . montelukast (SINGULAIR) 10 MG tablet Take 1 tablet (10 mg total) by mouth at bedtime. 90 tablet 3  . Multiple Vitamin (MULTIVITAMIN) tablet Take 1 tablet by mouth daily.    Marland Kitchen omeprazole (PRILOSEC) 20 MG capsule Take 20 mg by mouth daily.    . simvastatin (ZOCOR) 20 MG tablet Take 1 tablet (20 mg total) by mouth at bedtime. 90 tablet 3   No facility-administered medications prior to visit.     ROS See HPI  Objective:  BP 118/76   Pulse 83   Temp 98 F (36.7 C) (Oral)   Ht 5\' 6"  (1.676 m)   Wt (!) 314 lb (142.4 kg)   SpO2 97%   BMI 50.68 kg/m   BP Readings from Last 3 Encounters:  11/21/17 118/76  10/14/17 120/76  08/18/17 137/71    Wt Readings from Last 3 Encounters:  11/21/17 (!) 314 lb (142.4 kg)  10/14/17 (!) 310 lb (140.6 kg)  08/18/17 (!) 305 lb (138.3  kg)    Physical Exam  Cardiovascular: Normal rate.  Pulmonary/Chest: Effort normal.  Genitourinary: Pelvic exam was performed with patient supine. There is rash on the right labia. There is no tenderness on the right labia. There is rash on the left labia. There is no tenderness on the left labia. There is erythema in the vagina. Vaginal discharge found.  Genitourinary Comments: Chaperone present  Skin: Rash noted. There is erythema.  Vitals reviewed.   Lab Results  Component Value Date   WBC 8.3 08/18/2017   HGB 13.4 08/18/2017   HCT 39.8  08/18/2017   PLT 253.0 08/18/2017   GLUCOSE 125 (H) 08/18/2017   CHOL 200 08/18/2017   TRIG 177.0 (H) 08/18/2017   HDL 44.20 08/18/2017   LDLCALC 121 (H) 08/18/2017   ALT 12 08/18/2017   AST 16 08/18/2017   NA 138 08/18/2017   K 4.6 08/18/2017   CL 101 08/18/2017   CREATININE 0.79 08/18/2017   BUN 18 08/18/2017   CO2 30 08/18/2017   TSH 1.86 08/18/2017   HGBA1C 6.9 (H) 08/18/2017   MICROALBUR 0.9 08/18/2017    Assessment & Plan:   Lisa Patel was seen today for rash.  Diagnoses and all orders for this visit:  Intertrigo -     miconazole (MICOTIN) 2 % powder; Apply topically as needed for itching. -     fluconazole (DIFLUCAN) 150 MG tablet; Take 1 tablet (150 mg total) by mouth daily. Take second tab 3days apart from first tab  Vaginal discharge -     Cervicovaginal ancillary only( [place]) -     miconazole (MICOTIN) 2 % powder; Apply topically as needed for itching. -     fluconazole (DIFLUCAN) 150 MG tablet; Take 1 tablet (150 mg total) by mouth daily. Take second tab 3days apart from first tab   I am having Lisa Patel start on miconazole and fluconazole. I am also having her maintain her loratadine-pseudoephedrine, multivitamin, omeprazole, albuterol, citalopram, levothyroxine, lisinopril, montelukast, metFORMIN, simvastatin, levothyroxine, and Ibuprofen (ADVIL PO).  Meds ordered this encounter  Medications  . miconazole (MICOTIN) 2 % powder    Sig: Apply topically as needed for itching.    Dispense:  70 g    Refill:  0    Order Specific Question:   Supervising Provider    Answer:   MATTHEWS, CODY [[id number]]  . fluconazole (DIFLUCAN) 150 MG tablet    Sig: Take 1 tablet (150 mg total) by mouth daily. Take second tab 3days apart from first tab    Dispense:  2 tablet    Refill:  0    Order Specific Question:   Supervising Provider    Answer:   MATTHEWS, CODY [[id number]]    Follow-up: Return if symptoms worsen or fail to improve.  Wilfred Lacy, NP

## 2017-11-21 NOTE — Patient Instructions (Signed)
Hold simvastatin for next 4days, while on diflucan  You will be contacted with vaginal swab results.   Intertrigo Intertrigo is skin irritation or inflammation (dermatitis) that occurs when folds of skin rub together. The irritation can cause a rash and make skin raw and itchy. This condition most commonly occurs in the skin folds of these areas:  Toes.  Armpits.  Groin.  Belly.  Breasts.  Buttocks.  Intertrigo is not passed from person to person (is not contagious). What are the causes? This condition is caused by heat, moisture, friction, and lack of air circulation. The condition can be made worse by:  Sweat.  Bacteria or a fungus, such as yeast.  What increases the risk? This condition is more likely to occur if you have moisture in your skin folds. It is also more likely to develop in people who:  Have diabetes.  Are overweight.  Are on bed rest.  Live in a warm and moist climate.  Wear splints, braces, or other medical devices.  Are not able to control their bowels or bladder (have incontinence).  What are the signs or symptoms? Symptoms of this condition include:  A pink or red skin rash.  Brown patches on the skin.  Raw or scaly skin.  Itchiness.  A burning feeling.  Bleeding.  Leaking fluid.  A bad smell.  How is this diagnosed? This condition is diagnosed with a medical history and physical exam. You may also have a skin swab to test for bacteria or a fungus, such as yeast. How is this treated? Treatment may include:  Cleaning and drying your skin.  An oral antibiotic medicine or antibiotic skin cream for a bacterial infection.  Antifungal cream or pills for an infection that was caused by a fungus, such as yeast.  Steroid ointment to relieve itchiness and irritation.  Follow these instructions at home:  Keep the affected area clean and dry.  Do not scratch your skin.  Stay in a cool environment as much as possible. Use an air  conditioner or fan, if available.  Apply over-the-counter and prescription medicines only as told by your health care provider.  If you were prescribed an antibiotic medicine, use it as told by your health care provider. Do not stop using the antibiotic even if your condition improves.  Keep all follow-up visits as told by your health care provider. This is important. How is this prevented?  Maintain a healthy weight.  Take care of your feet, especially if you have diabetes. Foot care includes: ? Wearing shoes that fit well. ? Keeping your feet dry. ? Wearing clean, breathable socks.  Protect the skin around your groin and buttocks, especially if you have incontinence. Skin protection includes: ? Following a regular cleaning routine. ? Using moisturizers and skin protectants. ? Changing protection pads frequently.  Do not wear tight clothes. Wear clothes that are loose and absorbent. Wear clothes that are made of cotton.  Wear a bra that gives good support, if needed.  Shower and dry yourself thoroughly after activity. Use a hair dryer on a cool setting to dry between skin folds, especially after you bathe.  If you have diabetes, keep your blood sugar under control. Contact a health care provider if:  Your symptoms do not improve with treatment.  Your symptoms get worse or they spread.  You notice increased redness and warmth.  You have a fever. This information is not intended to replace advice given to you by your health care provider.  Make sure you discuss any questions you have with your health care provider. Document Released: 12/31/2004 Document Revised: 06/08/2015 Document Reviewed: 07/04/2014 Elsevier Interactive Patient Education  2018 [employer].

## 2017-11-24 ENCOUNTER — Encounter: Payer: Self-pay | Admitting: Nurse Practitioner

## 2017-11-25 LAB — CERVICOVAGINAL ANCILLARY ONLY
BACTERIAL VAGINITIS: NEGATIVE
CANDIDA VAGINITIS: NEGATIVE
Chlamydia: NEGATIVE
Neisseria Gonorrhea: NEGATIVE
TRICH (WINDOWPATH): NEGATIVE

## 2018-03-11 ENCOUNTER — Encounter: Payer: Self-pay | Admitting: Nurse Practitioner

## 2018-03-11 ENCOUNTER — Ambulatory Visit: Payer: BC Managed Care – PPO | Admitting: Nurse Practitioner

## 2018-03-11 VITALS — BP 114/72 | HR 78 | Temp 97.9°F | Ht 66.0 in | Wt 303.6 lb

## 2018-03-11 DIAGNOSIS — H1012 Acute atopic conjunctivitis, left eye: Secondary | ICD-10-CM | POA: Diagnosis not present

## 2018-03-11 MED ORDER — OLOPATADINE HCL 0.2 % OP SOLN: 1.0000 [drp] | Freq: Every day | OPHTHALMIC | 0 refills | Status: DC

## 2018-03-11 NOTE — Patient Instructions (Signed)
Call office if no improvement in 1week. will consider use of tobradex  if no improvement.  Allergic Conjunctivitis, Adult  Allergic conjunctivitis is inflammation of the clear membrane that covers the white part of your eye and the inner surface of your eyelid (conjunctiva). The inflammation is caused by allergies. The blood vessels in the conjunctiva become inflamed and this causes the eyes to become red or pink. The eyes often feel itchy. Allergic conjunctivitis cannot be spread from one person to another person (is not contagious). What are the causes? This condition is caused by an allergic reaction. Common causes of an allergic reaction (allergens) include:  Outdoor allergens, such as: ? Pollen. ? Grass and weeds. ? Mold spores.  Indoor allergens, such as: ? Dust. ? Smoke. ? Mold. ? Pet dander. ? Animal hair. What increases the risk? You may be more likely to develop this condition if you have a family history of allergies, such as:  Allergic rhinitis.  Bronchial asthma.  Atopic dermatitis. What are the signs or symptoms? Symptoms of this condition include eyes that are:  Itchy.  Red.  Watery.  Puffy. Your eyes may also:  Sting or burn.  Have clear drainage coming from them. How is this diagnosed? This condition may be diagnosed by medical history and physical exam. If you have drainage from your eyes, it may be tested to rule out other causes of conjunctivitis. You may also need to see a health care provider who specializes in treating allergies (allergist) or eye conditions (ophthalmologist) for tests to confirm the diagnosis. You may have:  Skin tests to see which allergens are causing your symptoms. These tests involve pricking the skin with a tiny needle and exposing the skin to small amounts of potential allergens to see if your skin reacts.  Blood tests.  Tissue scrapings from your eyelid. These will be examined under a microscope. How is this  treated? Treatments for this condition may include:  Cold cloths (compresses) to soothe itching and swelling.  Washing the face to remove allergens.  Eye drops. These may be prescription or over-the-counter. There are several different types. You may need to try different types to see which one works best for you. Your may need: ? Eye drops that block the allergic reaction (antihistamine). ? Eye drops that reduce swelling and irritation (anti-inflammatory). ? Steroid eye drops to lessen a severe reaction (vernal conjunctivitis).  Oral antihistamine medicines to reduce your allergic reaction. You may need these if eye drops do not help or are difficult to use. Follow these instructions at home:  Avoid known allergens whenever possible.  Take or apply over-the-counter and prescription medicines only as told by your health care provider. These include any eye drops.  Apply a cool, clean washcloth to your eye for 10-20 minutes, 3-4 times a day.  Do not touch or rub your eyes.  Do not wear contact lenses until the inflammation is gone. Wear glasses instead.  Do not wear eye makeup until the inflammation is gone.  Keep all follow-up visits as told by your health care provider. This is important. Contact a health care provider if:  Your symptoms get worse or do not improve with treatment.  You have mild eye pain.  You have sensitivity to light.  You have spots or blisters on your eyes.  You have pus draining from your eye.  You have a fever. Get help right away if:  You have redness, swelling, or other symptoms in only one eye.  Your vision is blurred or you have vision changes.  You have severe eye pain. This information is not intended to replace advice given to you by your health care provider. Make sure you discuss any questions you have with your health care provider. Document Released: 03/23/2002 Document Revised: 08/30/2015 Document Reviewed: 07/14/2015 Elsevier  Interactive Patient Education  2019 [employer].

## 2018-03-11 NOTE — Progress Notes (Signed)
Subjective:  Patient ID: Lisa Patel, female    DOB: [birth year]  Age: 48 y.o. MRN: [id number]  CC: Eye Pain (pt is complaining of left eye red,itchy,painful and drainage at times/going on 3 wks/)  Eye Problem   The left eye is affected. This is a new problem. The current episode started 1 to 4 weeks ago. The problem occurs constantly. The problem has been unchanged. There was no injury mechanism. There is no known exposure to pink eye. She does not wear contacts. Associated symptoms include eye redness, a foreign body sensation and itching. Pertinent negatives include no blurred vision, eye discharge, double vision, fever, nausea, photophobia, recent URI or vomiting. She has tried eye drops for the symptoms. The treatment provided no relief.   Reviewed past Medical, Social and Family history today.  Outpatient Medications Prior to Visit  Medication Sig Dispense Refill  . albuterol (PROVENTIL HFA;VENTOLIN HFA) 108 (90 Base) MCG/ACT inhaler Inhale 2 puffs into the lungs every 6 (six) hours as needed for wheezing or shortness of breath. 1 Inhaler 0  . citalopram (CELEXA) 20 MG tablet TAKE 1 TABLET(20 MG) BY MOUTH DAILY 90 tablet 3  . fluconazole (DIFLUCAN) 150 MG tablet Take 1 tablet (150 mg total) by mouth daily. Take second tab 3days apart from first tab 2 tablet 0  . Ibuprofen (ADVIL PO) Take by mouth.    . levothyroxine (SYNTHROID, LEVOTHROID) 100 MCG tablet TAKE 1 TABLET(100 MCG) BY MOUTH DAILY 90 tablet 3  . levothyroxine (SYNTHROID, LEVOTHROID) 88 MCG tablet TAKE 1 TABLET(88 MCG) BY MOUTH DAILY 90 tablet 1  . lisinopril (PRINIVIL,ZESTRIL) 5 MG tablet TAKE 1 TABLET(5 MG) BY MOUTH DAILY 90 tablet 3  . loratadine-pseudoephedrine (CLARITIN-D 24 HOUR) 10-240 MG per 24 hr tablet Take 1 tablet by mouth daily.      . metFORMIN (GLUCOPHAGE) 500 MG tablet Take 1 tablet (500 mg total) by mouth daily. 90 tablet 3  . montelukast (SINGULAIR) 10 MG tablet Take 1 tablet (10 mg total) by mouth at  bedtime. 90 tablet 3  . Multiple Vitamin (MULTIVITAMIN) tablet Take 1 tablet by mouth daily.    Marland Kitchen omeprazole (PRILOSEC) 20 MG capsule Take 20 mg by mouth daily.    . simvastatin (ZOCOR) 20 MG tablet Take 1 tablet (20 mg total) by mouth at bedtime. 90 tablet 3  . miconazole (MICOTIN) 2 % powder Apply topically as needed for itching. 70 g 0   No facility-administered medications prior to visit.     ROS See HPI  Objective:  BP 114/72   Pulse 78   Temp 97.9 F (36.6 C) (Oral)   Ht 5\' 6"  (1.676 m)   Wt (!) 303 lb 9.6 oz (137.7 kg)   SpO2 98%   BMI 49.00 kg/m   BP Readings from Last 3 Encounters:  03/11/18 114/72  11/21/17 118/76  10/14/17 120/76    Wt Readings from Last 3 Encounters:  03/11/18 (!) 303 lb 9.6 oz (137.7 kg)  11/21/17 (!) 314 lb (142.4 kg)  10/14/17 (!) 310 lb (140.6 kg)    Physical Exam Vitals signs reviewed.  Eyes:     General: Lids are normal. Lids are everted, no foreign bodies appreciated. Vision grossly intact. No scleral icterus.       Right eye: No foreign body, discharge or hordeolum.        Left eye: No foreign body, discharge or hordeolum.     Extraocular Movements: Extraocular movements intact.     Conjunctiva/sclera:  Right eye: Right conjunctiva is not injected. No chemosis, exudate or hemorrhage.    Left eye: Left conjunctiva is injected. Chemosis present. No exudate or hemorrhage.    Pupils: Pupils are equal, round, and reactive to light.  Cardiovascular:     Rate and Rhythm: Normal rate.     Pulses: Normal pulses.  Neurological:     Mental Status: She is alert.    Lab Results  Component Value Date   WBC 8.3 08/18/2017   HGB 13.4 08/18/2017   HCT 39.8 08/18/2017   PLT 253.0 08/18/2017   GLUCOSE 125 (H) 08/18/2017   CHOL 200 08/18/2017   TRIG 177.0 (H) 08/18/2017   HDL 44.20 08/18/2017   LDLCALC 121 (H) 08/18/2017   ALT 12 08/18/2017   AST 16 08/18/2017   NA 138 08/18/2017   K 4.6 08/18/2017   CL 101 08/18/2017    CREATININE 0.79 08/18/2017   BUN 18 08/18/2017   CO2 30 08/18/2017   TSH 1.86 08/18/2017   HGBA1C 6.9 (H) 08/18/2017   MICROALBUR 0.9 08/18/2017    Assessment & Plan:   Chrystle was seen today for eye pain.  Diagnoses and all orders for this visit:  Acute atopic conjunctivitis of left eye -     Olopatadine HCl 0.2 % SOLN; Apply 1 drop to eye daily.   I have discontinued Koryn Ortwein's miconazole. I am also having her start on Olopatadine HCl. Additionally, I am having her maintain her loratadine-pseudoephedrine, multivitamin, omeprazole, albuterol, citalopram, levothyroxine, lisinopril, montelukast, metFORMIN, simvastatin, levothyroxine, Ibuprofen (ADVIL PO), and fluconazole.  Meds ordered this encounter  Medications  . Olopatadine HCl 0.2 % SOLN    Sig: Apply 1 drop to eye daily.    Dispense:  2.5 mL    Refill:  0    Order Specific Question:   Supervising Provider    Answer:   Lucille Passy [[id number]]    Problem List Items Addressed This Visit    None    Visit Diagnoses    Acute atopic conjunctivitis of left eye    -  Primary   Relevant Medications   Olopatadine HCl 0.2 % SOLN       Follow-up: No follow-ups on file.  Wilfred Lacy, NP

## 2018-03-12 ENCOUNTER — Encounter: Payer: Self-pay | Admitting: Nurse Practitioner

## 2018-03-20 ENCOUNTER — Other Ambulatory Visit: Payer: Self-pay

## 2018-03-20 ENCOUNTER — Ambulatory Visit: Payer: BC Managed Care – PPO | Admitting: Family Medicine

## 2018-03-20 ENCOUNTER — Encounter: Payer: Self-pay | Admitting: Family Medicine

## 2018-03-20 VITALS — BP 100/80 | HR 106 | Temp 98.7°F | Wt 297.2 lb

## 2018-03-20 DIAGNOSIS — J209 Acute bronchitis, unspecified: Secondary | ICD-10-CM | POA: Diagnosis not present

## 2018-03-20 MED ORDER — BENZONATATE 100 MG PO CAPS: 100.0000 mg | ORAL_CAPSULE | Freq: Three times a day (TID) | ORAL | 0 refills | Status: DC | PRN

## 2018-03-20 MED ORDER — AZITHROMYCIN 250 MG PO TABS: ORAL_TABLET | ORAL | 0 refills | Status: DC

## 2018-03-20 NOTE — Progress Notes (Signed)
Chief Complaint  Patient presents with  . Cough    x8 days, Pt states having cough, congestion, sore throat. Pt states not having a fever. Pt states trying OTC Dayquil and Nyquil.    Lisa Patel here for URI complaints.  Duration: 8 days  Associated symptoms: fever (99.6 F), sinus congestion, rhinorrhea, sore throat and cough Denies: sinus pain, itchy watery eyes, ear pain, ear drainage, wheezing, shortness of breath and myalgia Treatment to date: Dayquil and Nyquil Sick contacts: Yes; is a Pharmacist, hospital  ROS:  Const: Denies current fevers HEENT: As noted in HPI Lungs: No SOB  Past Medical History:  Diagnosis Date  . Diabetes mellitus   . GERD (gastroesophageal reflux disease)   . Hyperlipidemia   . Obesity   . Panic attack   . Thyroid disease   . Vitamin D deficiency     BP 100/80 (BP Location: Left Arm, Patient Position: Sitting, Cuff Size: Large)   Pulse (!) 106   Temp 98.7 F (37.1 C) (Oral)   Wt 297 lb 3.2 oz (134.8 kg)   SpO2 97%   BMI 47.97 kg/m  General: Awake, alert, appears stated age HEENT: AT, [place], ears patent b/l and TM's neg, nares patent w/o discharge, pharynx pink and without exudates, MMM Neck: No masses or asymmetry Heart: RRR Lungs: CTAB, no accessory muscle use Psych: Age appropriate judgment and insight, normal mood and affect  Acute bronchitis, unspecified organism - Plan: azithromycin (ZITHROMAX) 250 MG tablet, benzonatate (TESSALON) 100 MG capsule  Orders as above. Wait 2 days prior to taking abx, if no better or worsening, take.  Continue to push fluids, practice good hand hygiene, cover mouth when coughing. F/u prn. If starting to experience fevers, shaking, or shortness of breath, seek immediate care. Pt voiced understanding and agreement to the plan.  [place], DO 03/20/18 2:25 PM

## 2018-03-20 NOTE — Patient Instructions (Addendum)
Continue to push fluids, practice good hand hygiene, and cover your mouth if you cough.  If you start having fevers, shaking or shortness of breath, seek immediate care.  For symptoms, consider using Vick's VapoRub on chest or under nose, air humidifier, Benadryl at night, and elevating the head of the bed. Tylenol and ibuprofen for aches and pains you may be experiencing.   Wait 2 days before taking the antibiotic (Azithromycin). If you start feeling better, no need to take it. If you are worsening or not improving, go ahead and take it.   Let us know if you need anything.

## 2018-08-24 ENCOUNTER — Other Ambulatory Visit: Payer: Self-pay | Admitting: Family Medicine

## 2018-08-24 DIAGNOSIS — E785 Hyperlipidemia, unspecified: Secondary | ICD-10-CM

## 2018-08-24 DIAGNOSIS — E1169 Type 2 diabetes mellitus with other specified complication: Secondary | ICD-10-CM

## 2018-08-24 DIAGNOSIS — E119 Type 2 diabetes mellitus without complications: Secondary | ICD-10-CM

## 2018-08-24 DIAGNOSIS — R062 Wheezing: Secondary | ICD-10-CM

## 2018-08-24 LAB — HM MAMMOGRAPHY

## 2018-08-26 ENCOUNTER — Other Ambulatory Visit: Payer: Self-pay | Admitting: *Deleted

## 2018-08-26 ENCOUNTER — Encounter: Payer: Self-pay | Admitting: Family Medicine

## 2018-08-26 DIAGNOSIS — E039 Hypothyroidism, unspecified: Secondary | ICD-10-CM

## 2018-08-26 MED ORDER — LEVOTHYROXINE SODIUM 88 MCG PO TABS: 88.0000 ug | ORAL_TABLET | Freq: Every day | ORAL | 0 refills | Status: DC

## 2018-09-01 NOTE — Progress Notes (Addendum)
[facility] [address] [place], [place], [place] [address] [phone]  Date:  09/03/2018   Name:  Lisa Patel   DOB:  Mar 06, 1970   MRN:  [id number]  PCP:  Darreld Mclean, MD    Chief Complaint: Annual Exam (no pap)   History of Present Illness:  Lisa Patel is a 48 y.o. very pleasant female patient who presents with the following:  Seen today for routine physical She saw my partner Dr. Nani Ravens in March for bronchitis I saw her most recently for a physical about 1 year ago History of controlled diabetes, hyperlipidemia, hypothyroidism  She is back at school- teaching virtually at [employer]   I started her on metformin and Zocor 1 year ago for worsening labs Current meds:  Celexa 20 mg Synthroid-- 100 mg Lisinopril 5 Metformin 500 daily Singulair Zocor 20 She does not use albuterol, has not needed it   Eye exam: this is on her list of things to do  Due foot exam today Colon cancer screening- she would prefer cologuard , - family history  Labs are due- she is not fasting  Mammogram up-to-date Pap last year Suggest Pneumovax, can also offer Shingrix- start shingrix today  Flu shot today   She is still menstruating regularly- but she is having a lot of pain and heavy bleeding.  She hates it, would be interested in some sort of treatment ?ablation. Referral to GYN Also notes that her hair is thinning for the last year or so, all over no bald spots   Lab Results  Component Value Date   HGBA1C 6.9 (H) 08/18/2017     Patient Active Problem List   Diagnosis Date Noted  . Numbness of foot 09/11/2011  . Hypothyroid 09/11/2011  . Diet-controlled diabetes mellitus ([place]) 10/02/2009  . Hyperlipidemia associated with type 2 diabetes mellitus ([place]) 10/02/2009  . ANXIETY STATE, UNSPECIFIED 09/04/2009  . PARESTHESIA 09/04/2009  . SNORING 09/04/2009  . VITAMIN D DEFICIENCY 08/31/2008  . HYPERKALEMIA  08/31/2008  . GERD 08/31/2008    Past Medical History:  Diagnosis Date  . Diabetes mellitus   . GERD (gastroesophageal reflux disease)   . Hyperlipidemia   . Obesity   . Panic attack   . Thyroid disease   . Vitamin D deficiency     No past surgical history on file.  Social History   Tobacco Use  . Smoking status: Never Smoker  . Smokeless tobacco: Never Used  Substance Use Topics  . Alcohol use: Not on file  . Drug use: Not on file    Family History  Problem Relation Age of Onset  . Diabetes Maternal Grandmother   . Diabetes Maternal Grandfather   . Diabetes Paternal Grandmother   . Diabetes Paternal Grandfather     No Known Allergies  Medication list has been reviewed and updated.  Current Outpatient Medications on File Prior to Visit  Medication Sig Dispense Refill  . Ibuprofen (ADVIL PO) Take by mouth.    . loratadine-pseudoephedrine (CLARITIN-D 24 HOUR) 10-240 MG per 24 hr tablet Take 1 tablet by mouth daily.      . Multiple Vitamin (MULTIVITAMIN) tablet Take 1 tablet by mouth daily.    Marland Kitchen omeprazole (PRILOSEC) 20 MG capsule Take 20 mg by mouth daily.    Marland Kitchen albuterol (PROVENTIL HFA;VENTOLIN HFA) 108 (90 Base) MCG/ACT inhaler Inhale 2 puffs into the lungs every 6 (six) hours as needed for wheezing or shortness  of breath. (Patient not taking: Reported on 09/03/2018) 1 Inhaler 0   No current facility-administered medications on file prior to visit.     Review of Systems:  As per HPI- otherwise negative.   Physical Examination: Vitals:   09/03/18 1435  BP: 122/80  Pulse: 78  Resp: 16  Temp: (!) 97.3 F (36.3 C)  SpO2: 98%   Vitals:   09/03/18 1435  Weight: (!) 301 lb (136.5 kg)  Height: 5\' 6"  (1.676 m)   Body mass index is 48.58 kg/m. Ideal Body Weight: Weight in (lb) to have BMI = 25: 154.6  GEN: WDWN, NAD, Non-toxic, A & O x 3 HEENT: Atraumatic, Normocephalic. Neck supple. No masses, No LAD. Ears and Nose: No external deformity. CV: RRR, No  M/G/R. No JVD. No thrill. No extra heart sounds. PULM: CTA B, no wheezes, crackles, rhonchi. No retractions. No resp. distress. No accessory muscle use. ABD: S, NT, ND, +BS. No rebound. No HSM. EXTR: No c/c/e NEURO Normal gait.  PSYCH: Normally interactive. Conversant. Not depressed or anxious appearing.  Calm demeanor.  Foot exam: normal  Wt Readings from Last 3 Encounters:  09/03/18 (!) 301 lb (136.5 kg)  03/20/18 297 lb 3.2 oz (134.8 kg)  03/11/18 (!) 303 lb 9.6 oz (137.7 kg)     Assessment and Plan:   ICD-10-CM   1. Physical exam  Z00.00   2. Hyperlipidemia associated with type 2 diabetes mellitus (HCC)  E11.69 Lipid panel   E78.5 simvastatin (ZOCOR) 20 MG tablet  3. Controlled type 2 diabetes mellitus without complication, without long-term current use of insulin (HCC)  E11.9 Comprehensive metabolic panel    Hemoglobin A1c    metFORMIN (GLUCOPHAGE) 500 MG tablet  4. Hypothyroidism (acquired)  E03.9 TSH    levothyroxine (SYNTHROID) 100 MCG tablet  5. Screening for HIV (human immunodeficiency virus)  Z11.4 HIV Antibody (routine testing w rflx)  6. Screening for deficiency anemia  Z13.0 CBC  7. Menorrhagia with regular cycle  N92.0 Ambulatory referral to Obstetrics / Gynecology    Ferritin  8. Anxiety state  F41.1 citalopram (CELEXA) 20 MG tablet  9. Diet-controlled diabetes mellitus (HCC)  E11.9 lisinopril (ZESTRIL) 5 MG tablet  10. Expiratory wheezing  R06.2 montelukast (SINGULAIR) 10 MG tablet  11. Immunization due  Z23 Varicella-zoster vaccine IM (Shingrix)  12. Thinning hair  L65.9 TSH    Ferritin  13. Needs flu shot  Z23 Flu Vaccine QUAD 6+ mos PF IM (Fluarix Quad PF)  14. Screening for colon cancer  Z12.11     CPE Refilled meds Flu and shingrix #1 Plan shingrix #2 and pneumoavax in 2-6 months  Ordered cologuard Routine labs Iron- may be related to menorrhagia and thinning hair Referral to gyn to discuss options BP under fine control  Follow-up: No follow-ups  on file.  Meds ordered this encounter  Medications  . levothyroxine (SYNTHROID) 100 MCG tablet    Sig: TAKE 1 TABLET(100 MCG) BY MOUTH DAILY    Dispense:  90 tablet    Refill:  3  . citalopram (CELEXA) 20 MG tablet    Sig: TAKE 1 TABLET(20 MG) BY MOUTH DAILY    Dispense:  90 tablet    Refill:  3  . lisinopril (ZESTRIL) 5 MG tablet    Sig: Take one by mouth daily    Dispense:  90 tablet    Refill:  3  . metFORMIN (GLUCOPHAGE) 500 MG tablet    Sig: TAKE 1 TABLET(500 MG) BY MOUTH DAILY  Dispense:  90 tablet    Refill:  3  . montelukast (SINGULAIR) 10 MG tablet    Sig: TAKE 1 TABLET(10 MG) BY MOUTH AT BEDTIME    Dispense:  90 tablet    Refill:  3  . simvastatin (ZOCOR) 20 MG tablet    Sig: TAKE 1 TABLET(20 MG) BY MOUTH AT BEDTIME    Dispense:  90 tablet    Refill:  3   Orders Placed This Encounter  Procedures  . Varicella-zoster vaccine IM (Shingrix)  . Flu Vaccine QUAD 6+ mos PF IM (Fluarix Quad PF)  . CBC  . Comprehensive metabolic panel  . Hemoglobin A1c  . Lipid panel  . HIV Antibody (routine testing w rflx)  . TSH  . Ferritin  . Ambulatory referral to Obstetrics / Gynecology    @SIGN @    Signed Lamar Blinks, MD  Received her labs 8/22- message to pt  Results for orders placed or performed in visit on 09/03/18  CBC  Result Value Ref Range   WBC 8.1 4.0 - 10.5 K/uL   RBC 4.36 3.87 - 5.11 Mil/uL   Platelets 241.0 150.0 - 400.0 K/uL   Hemoglobin 12.7 12.0 - 15.0 g/dL   HCT 38.0 36.0 - 46.0 %   MCV 87.0 78.0 - 100.0 fl   MCHC 33.4 30.0 - 36.0 g/dL   RDW 13.3 11.5 - 15.5 %  Comprehensive metabolic panel  Result Value Ref Range   Sodium 136 135 - 145 mEq/L   Potassium 4.6 3.5 - 5.1 mEq/L   Chloride 100 96 - 112 mEq/L   CO2 27 19 - 32 mEq/L   Glucose, Bld 130 (H) 70 - 99 mg/dL   BUN 24 (H) 6 - 23 mg/dL   Creatinine, Ser 0.75 0.40 - 1.20 mg/dL   Total Bilirubin 0.4 0.2 - 1.2 mg/dL   Alkaline Phosphatase 83 39 - 117 U/L   AST 26 0 - 37 U/L   ALT  17 0 - 35 U/L   Total Protein 7.0 6.0 - 8.3 g/dL   Albumin 4.2 3.5 - 5.2 g/dL   Calcium 9.3 8.4 - 10.5 mg/dL   GFR 81.74 >60.00 mL/min  Hemoglobin A1c  Result Value Ref Range   Hgb A1c MFr Bld 6.9 (H) 4.6 - 6.5 %  Lipid panel  Result Value Ref Range   Cholesterol 167 0 - 200 mg/dL   Triglycerides 161.0 (H) 0.0 - 149.0 mg/dL   HDL 46.60 >39.00 mg/dL   VLDL 32.2 0.0 - 40.0 mg/dL   LDL Cholesterol 89 0 - 99 mg/dL   Total CHOL/HDL Ratio 4    NonHDL 120.75   HIV Antibody (routine testing w rflx)  Result Value Ref Range   HIV 1&2 Ab, 4th Generation NON-REACTIVE NON-REACTI  TSH  Result Value Ref Range   TSH 2.28 0.35 - 4.50 uIU/mL  Ferritin  Result Value Ref Range   Ferritin 57.1 10.0 - 291.0 ng/mL

## 2018-09-01 NOTE — Patient Instructions (Addendum)
It was great to see you today, I will be in touch with your labs ASAP You got your flu shot today, 1st dose on Shingrix Please get your 2nd shingrix in 2-6 months You might also want to get a pneumonia shot- pneumovax- at that time.  Can be done as a nurse visit  We will refer you to GYN to discuss heavy and painful periods I will check your iron today If you are low on iron, this can contribute to hair loss  If your iron is normal I would recommend OTC roagaine foam to help regrown hair   Health Maintenance, Female Adopting a healthy lifestyle and getting preventive care are important in promoting health and wellness. Ask your health care provider about:  The right schedule for you to have regular tests and exams.  Things you can do on your own to prevent diseases and keep yourself healthy. What should I know about diet, weight, and exercise? Eat a healthy diet   Eat a diet that includes plenty of vegetables, fruits, low-fat dairy products, and lean protein.  Do not eat a lot of foods that are high in solid fats, added sugars, or sodium. Maintain a healthy weight Body mass index (BMI) is used to identify weight problems. It estimates body fat based on height and weight. Your health care provider can help determine your BMI and help you achieve or maintain a healthy weight. Get regular exercise Get regular exercise. This is one of the most important things you can do for your health. Most adults should:  Exercise for at least 150 minutes each week. The exercise should increase your heart rate and make you sweat (moderate-intensity exercise).  Do strengthening exercises at least twice a week. This is in addition to the moderate-intensity exercise.  Spend less time sitting. Even light physical activity can be beneficial. Watch cholesterol and blood lipids Have your blood tested for lipids and cholesterol at 50 years of age, then have this test every 5 years. Have your cholesterol  levels checked more often if:  Your lipid or cholesterol levels are high.  You are older than 50 years of age.  You are at high risk for heart disease. What should I know about cancer screening? Depending on your health history and family history, you may need to have cancer screening at various ages. This may include screening for:  Breast cancer.  Cervical cancer.  Colorectal cancer.  Skin cancer.  Lung cancer. What should I know about heart disease, diabetes, and high blood pressure? Blood pressure and heart disease  High blood pressure causes heart disease and increases the risk of stroke. This is more likely to develop in people who have high blood pressure readings, are of African descent, or are overweight.  Have your blood pressure checked: ? Every 3-5 years if you are 85-49 years of age. ? Every year if you are 46 years old or older. Diabetes Have regular diabetes screenings. This checks your fasting blood sugar level. Have the screening done:  Once every three years after age 37 if you are at a normal weight and have a low risk for diabetes.  More often and at a younger age if you are overweight or have a high risk for diabetes. What should I know about preventing infection? Hepatitis B If you have a higher risk for hepatitis B, you should be screened for this virus. Talk with your health care provider to find out if you are at risk for hepatitis  B infection. Hepatitis C Testing is recommended for:  Everyone born from [birth year] through 1967.  Anyone with known risk factors for hepatitis C. Sexually transmitted infections (STIs)  Get screened for STIs, including gonorrhea and chlamydia, if: ? You are sexually active and are younger than 50 years of age. ? You are older than 50 years of age and your health care provider tells you that you are at risk for this type of infection. ? Your sexual activity has changed since you were last screened, and you are at increased  risk for chlamydia or gonorrhea. Ask your health care provider if you are at risk.  Ask your health care provider about whether you are at high risk for HIV. Your health care provider may recommend a prescription medicine to help prevent HIV infection. If you choose to take medicine to prevent HIV, you should first get tested for HIV. You should then be tested every 3 months for as long as you are taking the medicine. Pregnancy  If you are about to stop having your period (premenopausal) and you may become pregnant, seek counseling before you get pregnant.  Take 400 to 800 micrograms (mcg) of folic acid every day if you become pregnant.  Ask for birth control (contraception) if you want to prevent pregnancy. Osteoporosis and menopause Osteoporosis is a disease in which the bones lose minerals and strength with aging. This can result in bone fractures. If you are 31 years old or older, or if you are at risk for osteoporosis and fractures, ask your health care provider if you should:  Be screened for bone loss.  Take a calcium or vitamin D supplement to lower your risk of fractures.  Be given hormone replacement therapy (HRT) to treat symptoms of menopause. Follow these instructions at home: Lifestyle  Do not use any products that contain nicotine or tobacco, such as cigarettes, e-cigarettes, and chewing tobacco. If you need help quitting, ask your health care provider.  Do not use street drugs.  Do not share needles.  Ask your health care provider for help if you need support or information about quitting drugs. Alcohol use  Do not drink alcohol if: ? Your health care provider tells you not to drink. ? You are pregnant, may be pregnant, or are planning to become pregnant.  If you drink alcohol: ? Limit how much you use to 0-1 drink a day. ? Limit intake if you are breastfeeding.  Be aware of how much alcohol is in your drink. In the [place] one drink equals one 12 oz bottle of beer  (355 mL), one 5 oz glass of wine (148 mL), or one 1 oz glass of hard liquor (44 mL). General instructions  Schedule regular health, dental, and eye exams.  Stay current with your vaccines.  Tell your health care provider if: ? You often feel depressed. ? You have ever been abused or do not feel safe at home. Summary  Adopting a healthy lifestyle and getting preventive care are important in promoting health and wellness.  Follow your health care provider's instructions about healthy diet, exercising, and getting tested or screened for diseases.  Follow your health care provider's instructions on monitoring your cholesterol and blood pressure. This information is not intended to replace advice given to you by your health care provider. Make sure you discuss any questions you have with your health care provider. Document Released: 07/16/2010 Document Revised: 12/24/2017 Document Reviewed: 12/24/2017 Elsevier Patient Education  2020 [employer].

## 2018-09-03 ENCOUNTER — Encounter: Payer: Self-pay | Admitting: Family Medicine

## 2018-09-03 ENCOUNTER — Ambulatory Visit (INDEPENDENT_AMBULATORY_CARE_PROVIDER_SITE_OTHER): Payer: BC Managed Care – PPO | Admitting: Family Medicine

## 2018-09-03 ENCOUNTER — Other Ambulatory Visit: Payer: Self-pay

## 2018-09-03 VITALS — BP 122/80 | HR 78 | Temp 97.3°F | Resp 16 | Ht 66.0 in | Wt 301.0 lb

## 2018-09-03 DIAGNOSIS — E1169 Type 2 diabetes mellitus with other specified complication: Secondary | ICD-10-CM | POA: Diagnosis not present

## 2018-09-03 DIAGNOSIS — Z1211 Encounter for screening for malignant neoplasm of colon: Secondary | ICD-10-CM

## 2018-09-03 DIAGNOSIS — E785 Hyperlipidemia, unspecified: Secondary | ICD-10-CM

## 2018-09-03 DIAGNOSIS — Z114 Encounter for screening for human immunodeficiency virus [HIV]: Secondary | ICD-10-CM

## 2018-09-03 DIAGNOSIS — N920 Excessive and frequent menstruation with regular cycle: Secondary | ICD-10-CM

## 2018-09-03 DIAGNOSIS — E039 Hypothyroidism, unspecified: Secondary | ICD-10-CM | POA: Diagnosis not present

## 2018-09-03 DIAGNOSIS — Z23 Encounter for immunization: Secondary | ICD-10-CM | POA: Diagnosis not present

## 2018-09-03 DIAGNOSIS — Z130 Encounter for screening for diseases of the blood and blood-forming organs and certain disorders involving the immune mechanism: Secondary | ICD-10-CM | POA: Diagnosis not present

## 2018-09-03 DIAGNOSIS — Z0000 Encounter for general adult medical examination without abnormal findings: Secondary | ICD-10-CM | POA: Diagnosis not present

## 2018-09-03 DIAGNOSIS — E119 Type 2 diabetes mellitus without complications: Secondary | ICD-10-CM | POA: Diagnosis not present

## 2018-09-03 DIAGNOSIS — R062 Wheezing: Secondary | ICD-10-CM

## 2018-09-03 DIAGNOSIS — L659 Nonscarring hair loss, unspecified: Secondary | ICD-10-CM | POA: Diagnosis not present

## 2018-09-03 DIAGNOSIS — F411 Generalized anxiety disorder: Secondary | ICD-10-CM

## 2018-09-03 MED ORDER — MONTELUKAST SODIUM 10 MG PO TABS: ORAL_TABLET | ORAL | 3 refills | Status: DC

## 2018-09-03 MED ORDER — LEVOTHYROXINE SODIUM 100 MCG PO TABS: ORAL_TABLET | ORAL | 3 refills | Status: DC

## 2018-09-03 MED ORDER — METFORMIN HCL 500 MG PO TABS: ORAL_TABLET | ORAL | 3 refills | Status: DC

## 2018-09-03 MED ORDER — SIMVASTATIN 20 MG PO TABS: ORAL_TABLET | ORAL | 3 refills | Status: DC

## 2018-09-03 MED ORDER — LISINOPRIL 5 MG PO TABS: ORAL_TABLET | ORAL | 3 refills | Status: DC

## 2018-09-03 MED ORDER — CITALOPRAM HYDROBROMIDE 20 MG PO TABS: ORAL_TABLET | ORAL | 3 refills | Status: DC

## 2018-09-04 LAB — CBC
HCT: 38 % (ref 36.0–46.0)
Hemoglobin: 12.7 g/dL (ref 12.0–15.0)
MCHC: 33.4 g/dL (ref 30.0–36.0)
MCV: 87 fl (ref 78.0–100.0)
Platelets: 241 10*3/uL (ref 150.0–400.0)
RBC: 4.36 Mil/uL (ref 3.87–5.11)
RDW: 13.3 % (ref 11.5–15.5)
WBC: 8.1 10*3/uL (ref 4.0–10.5)

## 2018-09-04 LAB — LIPID PANEL
Cholesterol: 167 mg/dL (ref 0–200)
HDL: 46.6 mg/dL (ref >39.00)
LDL Cholesterol: 89 mg/dL (ref 0–99)
NonHDL: 120.75
Total CHOL/HDL Ratio: 4
Triglycerides: 161 mg/dL — ABNORMAL HIGH (ref 0.0–149.0)
VLDL: 32.2 mg/dL (ref 0.0–40.0)

## 2018-09-04 LAB — TSH: TSH: 2.28 u[IU]/mL (ref 0.35–4.50)

## 2018-09-04 LAB — COMPREHENSIVE METABOLIC PANEL
ALT: 17 U/L (ref 0–35)
AST: 26 U/L (ref 0–37)
Albumin: 4.2 g/dL (ref 3.5–5.2)
Alkaline Phosphatase: 83 U/L (ref 39–117)
BUN: 24 mg/dL — ABNORMAL HIGH (ref 6–23)
CO2: 27 mEq/L (ref 19–32)
Calcium: 9.3 mg/dL (ref 8.4–10.5)
Chloride: 100 mEq/L (ref 96–112)
Creatinine, Ser: 0.75 mg/dL (ref 0.40–1.20)
GFR: 81.74 mL/min (ref >60.00)
Glucose, Bld: 130 mg/dL — ABNORMAL HIGH (ref 70–99)
Potassium: 4.6 mEq/L (ref 3.5–5.1)
Sodium: 136 mEq/L (ref 135–145)
Total Bilirubin: 0.4 mg/dL (ref 0.2–1.2)
Total Protein: 7 g/dL (ref 6.0–8.3)

## 2018-09-04 LAB — HIV ANTIBODY (ROUTINE TESTING W REFLEX): HIV 1&2 Ab, 4th Generation: NONREACTIVE

## 2018-09-04 LAB — HEMOGLOBIN A1C: Hgb A1c MFr Bld: 6.9 % — ABNORMAL HIGH (ref 4.6–6.5)

## 2018-09-04 LAB — FERRITIN: Ferritin: 57.1 ng/mL (ref 10.0–291.0)

## 2018-09-05 ENCOUNTER — Encounter: Payer: Self-pay | Admitting: Family Medicine

## 2018-10-17 ENCOUNTER — Encounter: Payer: Self-pay | Admitting: Family Medicine

## 2018-10-17 LAB — COLOGUARD

## 2018-10-27 ENCOUNTER — Other Ambulatory Visit: Payer: Self-pay | Admitting: Family Medicine

## 2018-10-27 ENCOUNTER — Encounter: Payer: Self-pay | Admitting: Family Medicine

## 2018-10-27 DIAGNOSIS — R195 Other fecal abnormalities: Secondary | ICD-10-CM

## 2018-11-15 ENCOUNTER — Encounter: Payer: Self-pay | Admitting: Family Medicine

## 2018-11-16 ENCOUNTER — Encounter: Payer: Self-pay | Admitting: Gastroenterology

## 2018-12-01 ENCOUNTER — Ambulatory Visit: Payer: BC Managed Care – PPO | Admitting: Gastroenterology

## 2018-12-01 ENCOUNTER — Encounter: Payer: Self-pay | Admitting: Gastroenterology

## 2018-12-01 ENCOUNTER — Other Ambulatory Visit: Payer: Self-pay

## 2018-12-01 VITALS — BP 116/72 | HR 81 | Temp 98.3°F | Ht 65.0 in | Wt 306.2 lb

## 2018-12-01 DIAGNOSIS — Z1211 Encounter for screening for malignant neoplasm of colon: Secondary | ICD-10-CM | POA: Diagnosis not present

## 2018-12-01 DIAGNOSIS — R195 Other fecal abnormalities: Secondary | ICD-10-CM

## 2018-12-01 DIAGNOSIS — K219 Gastro-esophageal reflux disease without esophagitis: Secondary | ICD-10-CM

## 2018-12-01 DIAGNOSIS — K5900 Constipation, unspecified: Secondary | ICD-10-CM

## 2018-12-01 NOTE — Patient Instructions (Signed)
If you are age 50 or older, your body mass index should be between 23-30. Your Body mass index is 50.96 kg/m. If this is out of the aforementioned range listed, please consider follow up with your Primary Care Provider.  If you are age 20 or younger, your body mass index should be between 19-25. Your Body mass index is 50.96 kg/m. If this is out of the aformentioned range listed, please consider follow up with your Primary Care Provider.   You will be contacted with an appointment date and time for your colonoscopy when this is available.   It was a pleasure to see you today!  Vito Cirigliano, D.O.

## 2018-12-01 NOTE — Progress Notes (Signed)
Chief Complaint: Positive Cologuard test   Referring Provider:     Darreld Mclean, MD   HPI:    Lisa Patel is a 48 y.o. female with a history of obesity (BMI 50.9), diabetes, hyperlipidemia, referred to the [facility] for evaluation of CRC screening in the setting of a positive Cologuard test in 10/2018.   She does endorse a history of chronic constipation, which is unchanged and well controlled with fiber gummies, maintaining regular, soft stools.  She otherwise denies nausea, vomiting, diarrhea, hematochezia, melena, night sweats, fever, chills, weight loss, early satiety, dysphagia, odynophagia.  Cologuard done as part of a routine CRC screening.  Separately, has a longstanding history of reflux, well controlled with omeprazole 20 mg/day.  No breakthrough symptoms.  Normal ferritin, CBC in 08/2018 with BUN 24 and o/w normal CMP.   No known family history of CRC, GI malignancy, liver disease, pancreatic disease, or IBD.   Past Medical History:  Diagnosis Date  . Diabetes mellitus   . GERD (gastroesophageal reflux disease)   . Hyperlipidemia   . Obesity   . Panic attack   . Thyroid disease   . Vitamin D deficiency      History reviewed. No pertinent surgical history. Family History  Problem Relation Age of Onset  . Diabetes Maternal Grandmother   . Clotting disorder Maternal Grandmother   . Diabetes Maternal Grandfather   . Diabetes Paternal Grandmother   . Diabetes Paternal Grandfather   . Colon cancer Neg Hx   . Esophageal cancer Neg Hx    Social History   Tobacco Use  . Smoking status: Never Smoker  . Smokeless tobacco: Never Used  Substance Use Topics  . Alcohol use: Not Currently  . Drug use: Never   Current Outpatient Medications  Medication Sig Dispense Refill  . citalopram (CELEXA) 20 MG tablet TAKE 1 TABLET(20 MG) BY MOUTH DAILY 90 tablet 3  . Ibuprofen (ADVIL PO) Take by mouth.    . levothyroxine (SYNTHROID) 100  MCG tablet TAKE 1 TABLET(100 MCG) BY MOUTH DAILY 90 tablet 3  . lisinopril (ZESTRIL) 5 MG tablet Take one by mouth daily 90 tablet 3  . loratadine-pseudoephedrine (CLARITIN-D 24 HOUR) 10-240 MG per 24 hr tablet Take 1 tablet by mouth daily.      . metFORMIN (GLUCOPHAGE) 500 MG tablet TAKE 1 TABLET(500 MG) BY MOUTH DAILY 90 tablet 3  . montelukast (SINGULAIR) 10 MG tablet TAKE 1 TABLET(10 MG) BY MOUTH AT BEDTIME 90 tablet 3  . Multiple Vitamin (MULTIVITAMIN) tablet Take 1 tablet by mouth daily.    Marland Kitchen omeprazole (PRILOSEC) 20 MG capsule Take 20 mg by mouth daily.    . simvastatin (ZOCOR) 20 MG tablet TAKE 1 TABLET(20 MG) BY MOUTH AT BEDTIME 90 tablet 3  . albuterol (PROVENTIL HFA;VENTOLIN HFA) 108 (90 Base) MCG/ACT inhaler Inhale 2 puffs into the lungs every 6 (six) hours as needed for wheezing or shortness of breath. (Patient not taking: Reported on 09/03/2018) 1 Inhaler 0   No current facility-administered medications for this visit.    No Known Allergies   Review of Systems: All systems reviewed and negative except where noted in HPI.     Physical Exam:    Wt Readings from Last 3 Encounters:  12/01/18 (!) 306 lb 4 oz (138.9 kg)  09/03/18 (!) 301 lb (136.5 kg)  03/20/18 297 lb 3.2 oz (134.8 kg)    BP 116/72  Pulse 81   Temp 98.3 F (36.8 C)   Ht 5\' 5"  (1.651 m)   Wt (!) 306 lb 4 oz (138.9 kg)   BMI 50.96 kg/m  Constitutional:  Pleasant, in no acute distress. Psychiatric: Normal mood and affect. Behavior is normal. EENT: Pupils normal.  Conjunctivae are normal. No scleral icterus. Neck supple. No cervical LAD. Cardiovascular: Normal rate, regular rhythm. No edema Pulmonary/chest: Effort normal and breath sounds normal. No wheezing, rales or rhonchi. Abdominal: Soft, nondistended, nontender. Bowel sounds active throughout. There are no masses palpable. No hepatomegaly. Neurological: Alert and oriented to person place and time. Skin: Skin is warm and dry. No rashes noted.    ASSESSMENT AND PLAN;   1) Positive Cologuard test 2) Morbid obesity  -Colonoscopy to be scheduled at [facility] due to BMI >50  3) GERD -Well-controlled with omeprazole 20 mg/day -Resume current antireflux regimen -Resume reflux lifestyle/dietary modifications  4) Constipation -Well-controlled with supplemental fiber, maintaining soft stools -No change at this time  The indications, risks, and benefits of colonoscopy were explained to the patient in detail. Risks include but are not limited to bleeding, perforation, adverse reaction to medications, and cardiopulmonary compromise. Sequelae include but are not limited to the possibility of surgery, hospitalization, and mortality. The patient verbalized understanding and wished to proceed. All questions answered, referred to the scheduler and bowel prep ordered. Further recommendations pending results of the exam.    [name], DO, FACG  12/01/2018, 8:16 AM   Copland, Gay Filler, MD

## 2018-12-02 ENCOUNTER — Ambulatory Visit (INDEPENDENT_AMBULATORY_CARE_PROVIDER_SITE_OTHER): Payer: BC Managed Care – PPO | Admitting: *Deleted

## 2018-12-02 DIAGNOSIS — Z23 Encounter for immunization: Secondary | ICD-10-CM | POA: Diagnosis not present

## 2018-12-02 NOTE — Progress Notes (Signed)
Patient here for second shingles vaccine.  Vaccine given in right deltoid and patient tolerated well.

## 2018-12-16 LAB — HM DIABETES EYE EXAM

## 2018-12-22 ENCOUNTER — Encounter: Payer: Self-pay | Admitting: Gastroenterology

## 2018-12-23 ENCOUNTER — Encounter: Payer: Self-pay | Admitting: Family Medicine

## 2018-12-25 ENCOUNTER — Telehealth: Payer: Self-pay | Admitting: Gastroenterology

## 2018-12-25 DIAGNOSIS — K5900 Constipation, unspecified: Secondary | ICD-10-CM

## 2018-12-25 DIAGNOSIS — Z1211 Encounter for screening for malignant neoplasm of colon: Secondary | ICD-10-CM

## 2018-12-25 DIAGNOSIS — K219 Gastro-esophageal reflux disease without esophagitis: Secondary | ICD-10-CM

## 2018-12-25 DIAGNOSIS — R195 Other fecal abnormalities: Secondary | ICD-10-CM

## 2018-12-25 NOTE — Telephone Encounter (Signed)
Can this patient be scheduled at [facility] in Jan? Please advise

## 2018-12-25 NOTE — Telephone Encounter (Signed)
Yes, okay to schedule for procedure at [facility] in January.  Thanks.

## 2018-12-28 ENCOUNTER — Other Ambulatory Visit: Payer: Self-pay

## 2018-12-28 DIAGNOSIS — R195 Other fecal abnormalities: Secondary | ICD-10-CM

## 2018-12-28 NOTE — Telephone Encounter (Signed)
Pt called wondering why her procedure needed to be scheduled at the hospital. I let her know that those were Dr. Vivia Ewing instructions. She will wait for your call to schedule procedure.

## 2018-12-28 NOTE — Telephone Encounter (Signed)
Called and spoke with patient- patient informed of need to schedule her procedure at [facility] has agreed to plan of care and has been scheduled for a previsit on 01/06/2019 at 1:30 pm; COVID screening on 01/14/2019 at 10:20 am; and has also been scheduled for her colonoscopy at [facility] on 01/18/2019 at 8:15 am; patient is aware she will receive her instructions on 01/06/2019 at her pre visit; Patient advised to call back to the office at [phone] should questions/concerns arise;  Patient verbalized understanding of information/instructions;

## 2019-01-06 ENCOUNTER — Other Ambulatory Visit: Payer: Self-pay

## 2019-01-06 ENCOUNTER — Ambulatory Visit (AMBULATORY_SURGERY_CENTER): Payer: BC Managed Care – PPO

## 2019-01-06 VITALS — Temp 97.2°F | Ht 65.0 in | Wt 304.4 lb

## 2019-01-06 DIAGNOSIS — Z1159 Encounter for screening for other viral diseases: Secondary | ICD-10-CM

## 2019-01-06 DIAGNOSIS — R195 Other fecal abnormalities: Secondary | ICD-10-CM

## 2019-01-06 DIAGNOSIS — Z1211 Encounter for screening for malignant neoplasm of colon: Secondary | ICD-10-CM

## 2019-01-06 MED ORDER — NA SULFATE-K SULFATE-MG SULF 17.5-3.13-1.6 GM/177ML PO SOLN: 1.0000 | Freq: Once | ORAL | 0 refills | Status: AC

## 2019-01-06 NOTE — Progress Notes (Signed)

## 2019-01-12 ENCOUNTER — Encounter: Payer: Self-pay | Admitting: Family Medicine

## 2019-01-14 ENCOUNTER — Other Ambulatory Visit (HOSPITAL_COMMUNITY)
Admission: RE | Admit: 2019-01-14 | Discharge: 2019-01-14 | Disposition: A | Discharge status: Home / Self Care | Payer: BC Managed Care – PPO | Source: Ambulatory Visit | Attending: Gastroenterology | Admitting: Gastroenterology

## 2019-01-14 DIAGNOSIS — Z01812 Encounter for preprocedural laboratory examination: Secondary | ICD-10-CM | POA: Diagnosis not present

## 2019-01-14 DIAGNOSIS — Z20828 Contact with and (suspected) exposure to other viral communicable diseases: Secondary | ICD-10-CM | POA: Diagnosis not present

## 2019-01-15 LAB — NOVEL CORONAVIRUS, NAA (HOSP ORDER, SEND-OUT TO REF LAB; TAT 18-24 HRS): SARS-CoV-2, NAA: NOT DETECTED

## 2019-01-17 NOTE — Anesthesia Preprocedure Evaluation (Addendum)
Anesthesia Evaluation  Patient identified by MRN, date of birth, ID band Patient awake    Reviewed: Allergy & Precautions, NPO status , Patient's Chart, lab work & pertinent test results  History of Anesthesia Complications Negative for: history of anesthetic complications  Airway Mallampati: III  TM Distance: >3 FB Neck ROM: Full    Dental no notable dental hx. (+) Dental Advisory Given   Pulmonary neg pulmonary ROS,    Pulmonary exam normal        Cardiovascular negative cardio ROS Normal cardiovascular exam     Neuro/Psych PSYCHIATRIC DISORDERS Anxiety negative neurological ROS     GI/Hepatic Neg liver ROS, GERD  Medicated,  Endo/Other  diabetesHypothyroidism Morbid obesity  Renal/GU negative Renal ROS     Musculoskeletal negative musculoskeletal ROS (+)   Abdominal   Peds  Hematology negative hematology ROS (+)   Anesthesia Other Findings Day of surgery medications reviewed with the patient.  Reproductive/Obstetrics                            Anesthesia Physical Anesthesia Plan  ASA: III  Anesthesia Plan: MAC   Post-op Pain Management:    Induction:   PONV Risk Score and Plan: 2 and Ondansetron and Propofol infusion  Airway Management Planned: Natural Airway  Additional Equipment:   Intra-op Plan:   Post-operative Plan:   Informed Consent: I have reviewed the patients History and Physical, chart, labs and discussed the procedure including the risks, benefits and alternatives for the proposed anesthesia with the patient or authorized representative who has indicated his/her understanding and acceptance.     Dental advisory given  Plan Discussed with: Anesthesiologist and CRNA  Anesthesia Plan Comments:        Anesthesia Quick Evaluation

## 2019-01-18 ENCOUNTER — Encounter (HOSPITAL_COMMUNITY)
Admission: RE | Disposition: A | Discharge status: Home / Self Care | Payer: Self-pay | Source: Home / Self Care | Attending: Gastroenterology

## 2019-01-18 ENCOUNTER — Ambulatory Visit (HOSPITAL_COMMUNITY): Payer: BC Managed Care – PPO | Admitting: Anesthesiology

## 2019-01-18 ENCOUNTER — Encounter (HOSPITAL_COMMUNITY): Payer: Self-pay | Admitting: Gastroenterology

## 2019-01-18 ENCOUNTER — Ambulatory Visit (HOSPITAL_COMMUNITY)
Admission: RE | Admit: 2019-01-18 | Discharge: 2019-01-18 | Disposition: A | Discharge status: Home / Self Care | Payer: BC Managed Care – PPO | Attending: Gastroenterology | Admitting: Gastroenterology

## 2019-01-18 ENCOUNTER — Other Ambulatory Visit: Payer: Self-pay

## 2019-01-18 DIAGNOSIS — E559 Vitamin D deficiency, unspecified: Secondary | ICD-10-CM | POA: Insufficient documentation

## 2019-01-18 DIAGNOSIS — E039 Hypothyroidism, unspecified: Secondary | ICD-10-CM | POA: Diagnosis not present

## 2019-01-18 DIAGNOSIS — K648 Other hemorrhoids: Secondary | ICD-10-CM | POA: Insufficient documentation

## 2019-01-18 DIAGNOSIS — Z7984 Long term (current) use of oral hypoglycemic drugs: Secondary | ICD-10-CM | POA: Diagnosis not present

## 2019-01-18 DIAGNOSIS — Z79890 Hormone replacement therapy (postmenopausal): Secondary | ICD-10-CM | POA: Insufficient documentation

## 2019-01-18 DIAGNOSIS — R195 Other fecal abnormalities: Secondary | ICD-10-CM

## 2019-01-18 DIAGNOSIS — K5900 Constipation, unspecified: Secondary | ICD-10-CM | POA: Insufficient documentation

## 2019-01-18 DIAGNOSIS — K219 Gastro-esophageal reflux disease without esophagitis: Secondary | ICD-10-CM | POA: Diagnosis not present

## 2019-01-18 DIAGNOSIS — Z6841 Body Mass Index (BMI) 40.0 and over, adult: Secondary | ICD-10-CM | POA: Insufficient documentation

## 2019-01-18 DIAGNOSIS — K5730 Diverticulosis of large intestine without perforation or abscess without bleeding: Secondary | ICD-10-CM

## 2019-01-18 DIAGNOSIS — Z79899 Other long term (current) drug therapy: Secondary | ICD-10-CM | POA: Insufficient documentation

## 2019-01-18 DIAGNOSIS — K641 Second degree hemorrhoids: Secondary | ICD-10-CM

## 2019-01-18 DIAGNOSIS — F410 Panic disorder [episodic paroxysmal anxiety] without agoraphobia: Secondary | ICD-10-CM | POA: Diagnosis not present

## 2019-01-18 DIAGNOSIS — J302 Other seasonal allergic rhinitis: Secondary | ICD-10-CM | POA: Insufficient documentation

## 2019-01-18 DIAGNOSIS — E119 Type 2 diabetes mellitus without complications: Secondary | ICD-10-CM | POA: Diagnosis not present

## 2019-01-18 DIAGNOSIS — E785 Hyperlipidemia, unspecified: Secondary | ICD-10-CM | POA: Insufficient documentation

## 2019-01-18 HISTORY — PX: COLONOSCOPY WITH PROPOFOL: SHX5780

## 2019-01-18 LAB — GLUCOSE, CAPILLARY: Glucose-Capillary: 134 mg/dL — ABNORMAL HIGH (ref 70–99)

## 2019-01-18 SURGERY — COLONOSCOPY WITH PROPOFOL
Anesthesia: Monitor Anesthesia Care

## 2019-01-18 MED ORDER — PROPOFOL 500 MG/50ML IV EMUL
INTRAVENOUS | Status: AC
  Filled 2019-01-18: qty 50

## 2019-01-18 MED ORDER — PROPOFOL 500 MG/50ML IV EMUL
INTRAVENOUS | Status: DC | PRN
  Administered 2019-01-18: 135 ug/kg/min via INTRAVENOUS

## 2019-01-18 MED ORDER — LACTATED RINGERS IV SOLN
INTRAVENOUS | Status: DC
  Administered 2019-01-18: 1000 mL via INTRAVENOUS

## 2019-01-18 MED ORDER — SODIUM CHLORIDE 0.9 % IV SOLN: INTRAVENOUS | Status: DC

## 2019-01-18 MED ORDER — PROPOFOL 10 MG/ML IV BOLUS
INTRAVENOUS | Status: DC | PRN
  Administered 2019-01-18: 20 mg via INTRAVENOUS
  Administered 2019-01-18: 10 mg via INTRAVENOUS

## 2019-01-18 SURGICAL SUPPLY — 21 items

## 2019-01-18 NOTE — Anesthesia Postprocedure Evaluation (Signed)
Anesthesia Post Note  Patient: Lisa Patel  Procedure(s) Performed: COLONOSCOPY WITH PROPOFOL (N/A )     Patient location during evaluation: Endoscopy Anesthesia Type: MAC Level of consciousness: awake and alert Pain management: pain level controlled Vital Signs Assessment: post-procedure vital signs reviewed and stable Respiratory status: spontaneous breathing and respiratory function stable Cardiovascular status: stable Postop Assessment: no apparent nausea or vomiting Anesthetic complications: no    Last Vitals:  Vitals:   01/18/19 0900 01/18/19 0910  BP: (!) 108/52 130/86  Pulse: (!) 58 61  Resp: 15 15  Temp:    SpO2: 100% 100%    Last Pain:  Vitals:   01/18/19 0910  TempSrc:   PainSc: 0-No pain                 Anndee Connett DANIEL

## 2019-01-18 NOTE — Discharge Instructions (Signed)
YOU HAD AN ENDOSCOPIC PROCEDURE TODAY: Refer to the procedure report and other information in the discharge instructions given to you for any specific questions about what was found during the examination. If this information does not answer your questions, please call [place] office at [phone] to clarify.  ° °YOU SHOULD EXPECT: Some feelings of bloating in the abdomen. Passage of more gas than usual. Walking can help get rid of the air that was put into your GI tract during the procedure and reduce the bloating. If you had a lower endoscopy (such as a colonoscopy or flexible sigmoidoscopy) you may notice spotting of blood in your stool or on the toilet paper. Some abdominal soreness may be present for a day or two, also. ° °DIET: Your first meal following the procedure should be a light meal and then it is ok to progress to your normal diet. A half-sandwich or bowl of soup is an example of a good first meal. Heavy or fried foods are harder to digest and may make you feel nauseous or bloated. Drink plenty of fluids but you should avoid alcoholic beverages for 24 hours. If you had a esophageal dilation, please see attached instructions for diet.   ° °ACTIVITY: Your care partner should take you home directly after the procedure. You should plan to take it easy, moving slowly for the rest of the day. You can resume normal activity the day after the procedure however YOU SHOULD NOT DRIVE, use power tools, machinery or perform tasks that involve climbing or major physical exertion for 24 hours (because of the sedation medicines used during the test).  ° °SYMPTOMS TO REPORT IMMEDIATELY: °A gastroenterologist can be reached at any hour. Please call [phone]  for any of the following symptoms:  °Following lower endoscopy (colonoscopy, flexible sigmoidoscopy) °Excessive amounts of blood in the stool  °Significant tenderness, worsening of abdominal pains  °Swelling of the abdomen that is new, acute  °Fever of 100° or  higher  °Following upper endoscopy (EGD, EUS, ERCP, esophageal dilation) °Vomiting of blood or coffee ground material  °New, significant abdominal pain  °New, significant chest pain or pain under the shoulder blades  °Painful or persistently difficult swallowing  °New shortness of breath  °Black, tarry-looking or red, bloody stools ° °FOLLOW UP:  °If any biopsies were taken you will be contacted by phone or by letter within the next 1-3 weeks. Call [phone]  if you have not heard about the biopsies in 3 weeks.  °Please also call with any specific questions about appointments or follow up tests. ° °

## 2019-01-18 NOTE — Op Note (Signed)
[facility] Patient Name: Lisa Patel Procedure Date: 01/18/2019 MRN: [id number] Attending MD: Gerrit Heck , MD Date of Birth: 09/15/1970 CSN: [id number] Age: 49 Admit Type: Outpatient Procedure:                Colonoscopy Indications:              Positive Cologuard test in 10/2018. Otherwise, no                            overt GI blood loss. Providers:                Gerrit Heck, MD, Cleda Daub, RN, Janie                            [place], [facility] CRNA Referring MD:              Medicines:                Monitored Anesthesia Care Complications:            No immediate complications. Estimated Blood Loss:     Estimated blood loss: none. Procedure:                Pre-Anesthesia Assessment:                           - Prior to the procedure, a History and Physical                            was performed, and patient medications and                            allergies were reviewed. The patient's tolerance of                            previous anesthesia was also reviewed. The risks                            and benefits of the procedure and the sedation                            options and risks were discussed with the patient.                            All questions were answered, and informed consent                            was obtained. Prior Anticoagulants: The patient has                            taken no previous anticoagulant or antiplatelet                            agents. ASA Grade Assessment: III - A patient with  severe systemic disease. After reviewing the risks                            and benefits, the patient was deemed in                            satisfactory condition to undergo the procedure.                           After obtaining informed consent, the colonoscope                            was passed under direct vision. Throughout the   procedure, the patient's blood pressure, pulse, and                            oxygen saturations were monitored continuously. The                            [id number]) Olympus colonoscope was                            introduced through the anus and advanced to the the                            cecum, identified by appendiceal orifice and                            ileocecal valve. The colonoscopy was performed                            without difficulty. The patient tolerated the                            procedure well. The quality of the bowel                            preparation was adequate. The ileocecal valve,                            appendiceal orifice, and rectum were photographed. Scope In: 8:19:52 AM Scope Out: 8:39:53 AM Scope Withdrawal Time: 0 hours 16 minutes 41 seconds  Total Procedure Duration: 0 hours 20 minutes 1 second  Findings:      Hemorrhoids were found on perianal exam.      Multiple small-mouthed diverticula were found in the sigmoid colon.      Non-bleeding internal hemorrhoids were found during retroflexion. The       hemorrhoids were small.      The exam was otherwise normal throughout the remainder of the colon. Impression:               - Hemorrhoids found on perianal exam.                           - Diverticulosis in the sigmoid colon.                           -  Non-bleeding internal hemorrhoids.                           - No specimens collected. Moderate Sedation:      Not Applicable - Patient had care per Anesthesia. Recommendation:           - Patient has a contact number available for                            emergencies. The signs and symptoms of potential                            delayed complications were discussed with the                            patient. Return to normal activities tomorrow.                            Written discharge instructions were provided to the                            patient.                            - Resume previous diet today.                           - Continue present medications.                           - Use fiber, for example Citrucel, Fibercon, Konsyl                            or Metamucil.                           - Repeat colonoscopy in 10 years for screening                            purposes.                           - Return to GI office PRN. Procedure Code(s):        --- Professional ---                           [phone], Colonoscopy, flexible; diagnostic, including                            collection of specimen(s) by brushing or washing,                            when performed (separate procedure) Diagnosis Code(s):        --- Professional ---                           [id number], Other hemorrhoids  R19.5, Other fecal abnormalities                           K57.30, Diverticulosis of large intestine without                            perforation or abscess without bleeding CPT copyright 2019 American [employer] Association. All rights reserved. The codes documented in this report are preliminary and upon coder review may  be revised to meet current compliance requirements. Gerrit Heck, MD 01/18/2019 8:49:03 AM Number of Addenda: 0

## 2019-01-18 NOTE — Interval H&P Note (Signed)
History and Physical Interval Note:  01/18/2019 7:58 AM  Lisa Patel  has presented today for surgery, with the diagnosis of GERD/constipation/+ cologuard/screening/morbid obesity.  The various methods of treatment have been discussed with the patient and family. After consideration of risks, benefits and other options for treatment, the patient has consented to  Procedure(s): COLONOSCOPY WITH PROPOFOL (N/A) as a surgical intervention.  The patient's history has been reviewed, patient examined, no change in status, stable for surgery.  I have reviewed the patient's chart and labs.  Questions were answered to the patient's satisfaction.     [name]

## 2019-01-18 NOTE — Transfer of Care (Signed)
Immediate Anesthesia Transfer of Care Note  Patient: Lisa Patel  Procedure(s) Performed: COLONOSCOPY WITH PROPOFOL (N/A )  Patient Location: PACU  Anesthesia Type:MAC  Level of Consciousness: awake, alert  and oriented  Airway & Oxygen Therapy: Patient Spontanous Breathing and Patient connected to face mask oxygen  Post-op Assessment: Report given to RN, Post -op Vital signs reviewed and stable and Patient moving all extremities X 4  Post vital signs: Reviewed and stable  Last Vitals:  Vitals Value Taken Time  BP    Temp    Pulse    Resp    SpO2      Last Pain:  Vitals:   01/18/19 0701  TempSrc: Oral  PainSc: 0-No pain         Complications: No apparent anesthesia complications

## 2019-01-18 NOTE — H&P (Signed)
    P  Chief Complaint:    Positive Cologuard test  GI History: Lisa Patel is a 49 y.o. female with a history of obesity (BMI 50.9), diabetes, hyperlipidemia, referred to the [facility] in 11/2018 for evaluation of CRC screening in the setting of a positive Cologuard test in 10/2018.   She does endorse a history of chronic constipation, which is unchanged and well controlled with fiber gummies, maintaining regular, soft stools.  She otherwise denies nausea, vomiting, diarrhea, hematochezia, melena, night sweats, fever, chills, weight loss, early satiety, dysphagia, odynophagia.  Cologuard done as part of a routine CRC screening.  Separately, has a longstanding history of reflux, well controlled with omeprazole 20 mg/day.  No breakthrough symptoms.  Normal ferritin, CBC in 08/2018 with BUN 24 and o/w normal CMP.   No known family history of CRC, GI malignancy, liver disease, pancreatic disease, or IBD.   HPI:     Patient is a 49 y.o. female presenting today for colonoscopy for evaluation of positive Cologuard as outlined above.  No change in medical history since her last appointment.   Review of systems:     No chest pain, no SOB, no fevers, no urinary sx   Past Medical History:  Diagnosis Date  . Allergy    seasonal  . Diabetes mellitus   . GERD (gastroesophageal reflux disease)   . Hyperlipidemia   . Normal vaginal delivery    02-1993 and 2000 had epidural anesthesia  . Obesity   . Panic attack   . Thyroid disease   . Vitamin D deficiency     Patient's surgical history, family medical history, social history, medications and allergies were all reviewed in Epic    Current Facility-Administered Medications  Medication Dose Route Frequency Provider Last Rate Last Admin  . 0.9 %  sodium chloride infusion   Intravenous Continuous Quentin Shorey V, DO      . lactated ringers infusion   Intravenous Continuous Arlayne Liggins V, DO 10 mL/hr at 01/18/19  0720 1,000 mL at 01/18/19 0720    Physical Exam:     BP (!) 159/76   Pulse 72   Temp 98.2 F (36.8 C) (Oral)   Resp 16   Ht 5\' 5"  (1.651 m)   Wt (!) 138 kg   LMP 01/14/2019 Comment: there is no chance you are pregnant  SpO2 99%   BMI 50.63 kg/m   GENERAL:  Pleasant female in NAD PSYCH: : Cooperative, normal affect EENT:  conjunctiva pink, mucous membranes moist, neck supple without masses CARDIAC:  RRR, no murmur heard, no peripheral edema PULM: Normal respiratory effort, lungs CTA bilaterally, no wheezing ABDOMEN:  Nondistended, soft, nontender. No obvious masses, no hepatomegaly,  normal bowel sounds SKIN:  turgor, no lesions seen Musculoskeletal:  Normal muscle tone, normal strength NEURO: Alert and oriented x 3, no focal neurologic deficits   IMPRESSION and PLAN:     1) positive Cologuard test 2) Morbid obesity -Colonoscopy today at [facility] due to BMI >50  3) GERD -Well-controlled with omeprazole 20 mg/day -Resume current antireflux regimen -Resume reflux lifestyle/dietary modifications  4) Constipation -Well-controlled with supplemental fiber, maintaining soft stools -No change at this time          [name] ,DO, FACG 01/18/2019, 7:56 AM

## 2019-01-18 NOTE — Anesthesia Procedure Notes (Signed)
Procedure Name: MAC Date/Time: 01/18/2019 8:12 AM Performed by: Niel Hummer, CRNA Pre-anesthesia Checklist: Patient identified, Emergency Drugs available, Suction available and Patient being monitored Patient Re-evaluated:Patient Re-evaluated prior to induction Oxygen Delivery Method: Simple face mask

## 2019-01-19 ENCOUNTER — Encounter: Payer: BC Managed Care – PPO | Admitting: Gastroenterology

## 2019-01-20 ENCOUNTER — Encounter: Payer: Self-pay | Admitting: *Deleted

## 2019-02-07 ENCOUNTER — Encounter: Payer: Self-pay | Admitting: Family Medicine

## 2019-02-08 ENCOUNTER — Encounter: Payer: Self-pay | Admitting: Family Medicine

## 2019-02-08 NOTE — Telephone Encounter (Signed)
I am good at portion control with breakfast and lunch most days, and generally prepare lower calorie meals. However my dinner portions tend to be larger than they should be and usually made more for taste and less on health.  I do not eat seconds.  I do not midnight snack.  I understand that the surgery is not a magic event.  I will have to work hard to manage my weight for the rest of my life.  I feel confident with very specific guidelines of what I CANNOT do (like I have with processed sugar for many years now) I can follow through.  More importantly, is psychologically I am really hoping that if I see some real success it will add that extra layer of motivation I have been missing.   Aniela, Caniglia  You 2 hours ago (9:38 AM)   Part 2 From 2008-2018 I have attempted to reduce calories.  I tries walking regularly on my treadmill.  I have joint the [employer] and worked out.  Around 2013 or so when first diagnosed as being borderline diabetic I have given up all processed sugar.   I have been around 300 -308 pounds for a very long time now.  I have done Nutirsystem 2 more times, the most recent was last spring 2020.  The first time I lost about 20 and then plateaued.  This most recent time I lost about 11 then plateaued, I was told I needed to eat more.  I did and lost a few more and plateaued.  During this whole time we were walking a mile at least 4 days a week and I was riding a stationary recumbent bike for 30 minutes at least 3 times a week.  I gained it all back.  My normal diet contains fruit and vegetable, avoiding breads whenever possible, only no sugar added or sugar free sweets, and then only in moderation.  I eat 3 amin meals a day and 3 healthy snacks.  I drink a lot of water.   Tallent, Madelon  You 2 hours ago (9:37 AM)   In 1983 when I was 11 I lost about 50 pounds by increasing my activity and decreasing sweets and extra carbs.  I fluctuated between 145-165 until I was 44 and met my husband in  26.  By my wedding in 1993 I was about 190.  I tried Weight Watchers with walking and lost some, but gained back when I stopped.   By 2111 when I had my first child I was well over 200.  In [phone] I lost a lot of weight with Phen Phen. back down to 180.  I gained  back weight and did Nutrisystem for the first time 1998 or 1999.  I don't recall the outcome, but it did not last long because it was expensive and I was not seeing results.  By 2000 I was back well over 200 and had my second child.  Since then I tried Weight Watchers one more time without success.

## 2019-03-17 ENCOUNTER — Encounter: Payer: BC Managed Care – PPO | Attending: General Surgery | Admitting: Skilled Nursing Facility1

## 2019-03-17 ENCOUNTER — Encounter: Payer: Self-pay | Admitting: Skilled Nursing Facility1

## 2019-03-17 ENCOUNTER — Other Ambulatory Visit: Payer: Self-pay

## 2019-03-17 ENCOUNTER — Ambulatory Visit (INDEPENDENT_AMBULATORY_CARE_PROVIDER_SITE_OTHER): Payer: BC Managed Care – PPO | Admitting: Psychology

## 2019-03-17 DIAGNOSIS — E669 Obesity, unspecified: Secondary | ICD-10-CM | POA: Insufficient documentation

## 2019-03-17 DIAGNOSIS — F509 Eating disorder, unspecified: Secondary | ICD-10-CM

## 2019-03-17 NOTE — Progress Notes (Addendum)
Nutrition Assessment for Bariatric Surgery Medical Nutrition Therapy  Patient was seen on 03/03/2021for Pre-Operative Nutrition Assessment. Letter of approval faxed to [facility] bariatric surgery program coordinator on 03/17/2019   Referral stated Supervised Weight Loss (SWL) visits needed: 0  Planned surgery: RYGB Pt expectation of surgery: mental restriction  Pt expectation of dietitian: unaware  Nutrition Clearance: Based on pts understanding of behavioral changes necessary to be successful with surgery; Pt seems reasonably prepared for surgery     NUTRITION ASSESSMENT   Anthropometrics  Start weight at [facility]: 310.1 lbs (date: 03/17/2019)  Height: 65 in BMI: 51.60 kg/m2     Clinical  Medical hx: anxiety, diabetes  Medications:  Labs: A1C 6.9 Notable signs/symptoms:   Lifestyle & Dietary Hx  Pt states she does have anxiety and does not know what causes her late night panic attacks. Pt states when she was diagnosed with diabetes she stopped eating all sugars. Pt states she gets shaky/sweaty feeling better with eating crackers happening more often in the last 2 months (unknown cause as she does not recall). Pt states because her husband is on Nutrisystem he has been more support of a healthier diet. Pt states she has not picking at her food when putting it up and not eating second portions.  Pt was preoccupied with the possibility of dieing from not having the surgery and also on the table.   24-Hr Dietary Recall First Meal: 2 rice cakes with 2 [place] sausages patties with cheese Snack: [place] jeryk Snack:hard boiled egg or cheese stick Second Meal: frozen meal Snack: fruit cup in sugar free jello Snack: greek yogurt Third Meal: salad with veggies and lite dressing with potato casserole or spagehh Snack: sugar free chocolate or sugar free chocolate cookie or almond milk and sugar free hot chocolate Beverages: herbal tea with non dairy creamer, water, crystal  light   Estimated Energy Needs Calories: 1500 Carbohydrate: 170 g Protein: 112 g Fat: 42 g   NUTRITION DIAGNOSIS  Overweight/obesity ([place]-3.3) related to past poor dietary habits and physical inactivity as evidenced by patient w/ planned RYGB surgery following dietary guidelines for continued weight loss.    NUTRITION INTERVENTION  Nutrition counseling (C-1) and education (E-2) to facilitate bariatric surgery goals.   Pre-Op Goals Reviewed with the Patient . Track food and beverage intake (pen and paper, MyFitness Pal, Baritastic app, etc.) . Make healthy food choices while monitoring portion sizes . Consume 3 meals per day or try to eat every 3-5 hours . Avoid concentrated sugars and fried foods . Keep sugar & fat in the single digits per serving on food labels . Practice CHEWING your food (aim for applesauce consistency) . Practice not drinking 15 minutes before, during, and 30 minutes after each meal and snack . Avoid all carbonated beverages (ex: soda, sparkling beverages)  . Limit caffeinated beverages (ex: coffee, tea, energy drinks) . Avoid all sugar-sweetened beverages (ex: regular soda, sports drinks)  . Avoid alcohol  . Aim for 64-100 ounces of FLUID daily (with at least half of fluid intake being plain water)  . Aim for at least 60-80 grams of PROTEIN daily . Look for a liquid protein source that contains ?15 g protein and ?5 g carbohydrate (ex: shakes, drinks, shots) . Make a list of non-food related activities . Physical activity is an important part of a healthy lifestyle so keep it moving! The goal is to reach 150 minutes of exercise per week, including cardiovascular and weight baring activity.  *Goals that  are bolded indicate the pt would like to start working towards these  Handouts Provided Include  . Bariatric Surgery handouts (Nutrition Visits, Pre-Op Goals, Protein Shakes, Vitamins & Minerals)  Learning Style & Readiness for Change Teaching method  utilized: Visual & Auditory  Demonstrated degree of understanding via: Teach Back  Barriers to learning/adherence to lifestyle change: none identified      MONITORING & EVALUATION Dietary intake, weekly physical activity, body weight, and pre-op goals reached at next nutrition visit.    Next Steps  Patient is to follow up at [place] for Pre-Op Class >2 weeks before surgery for further nutrition education.

## 2019-03-22 ENCOUNTER — Other Ambulatory Visit (HOSPITAL_COMMUNITY): Payer: Self-pay | Admitting: General Surgery

## 2019-03-22 ENCOUNTER — Other Ambulatory Visit: Payer: Self-pay | Admitting: General Surgery

## 2019-03-31 ENCOUNTER — Ambulatory Visit (HOSPITAL_COMMUNITY)
Admission: RE | Admit: 2019-03-31 | Discharge: 2019-03-31 | Disposition: A | Discharge status: Home / Self Care | Payer: BC Managed Care – PPO | Source: Ambulatory Visit | Attending: General Surgery | Admitting: General Surgery

## 2019-03-31 ENCOUNTER — Ambulatory Visit (INDEPENDENT_AMBULATORY_CARE_PROVIDER_SITE_OTHER): Payer: BC Managed Care – PPO | Admitting: Psychology

## 2019-03-31 ENCOUNTER — Other Ambulatory Visit: Payer: Self-pay

## 2019-03-31 DIAGNOSIS — F509 Eating disorder, unspecified: Secondary | ICD-10-CM

## 2019-04-06 ENCOUNTER — Encounter: Payer: Self-pay | Admitting: Medical

## 2019-04-06 ENCOUNTER — Other Ambulatory Visit: Payer: Self-pay

## 2019-04-06 ENCOUNTER — Ambulatory Visit: Payer: BC Managed Care – PPO | Admitting: Medical

## 2019-04-06 VITALS — BP 154/77 | HR 97 | Temp 97.0°F | Resp 18 | Ht 65.0 in | Wt 306.0 lb

## 2019-04-06 DIAGNOSIS — R300 Dysuria: Secondary | ICD-10-CM | POA: Diagnosis not present

## 2019-04-06 LAB — POC URINALSYSI DIPSTICK (AUTOMATED)
Bilirubin, UA: 1
Blood, UA: 3
Glucose, UA: NEGATIVE
Ketones, UA: NEGATIVE
Nitrite, UA: NEGATIVE
Protein, UA: POSITIVE — AB
Spec Grav, UA: 1.025 (ref 1.010–1.025)
Urobilinogen, UA: 0.2 E.U./dL
pH, UA: 5.5 (ref 5.0–8.0)

## 2019-04-06 MED ORDER — CIPROFLOXACIN HCL 250 MG PO TABS: 250.0000 mg | ORAL_TABLET | Freq: Two times a day (BID) | ORAL | 0 refills | Status: DC

## 2019-04-06 MED ORDER — PHENAZOPYRIDINE HCL 200 MG PO TABS: 200.0000 mg | ORAL_TABLET | Freq: Three times a day (TID) | ORAL | 0 refills | Status: DC | PRN

## 2019-04-06 NOTE — Patient Instructions (Signed)
You appear to have a urinary tract infection. I am prescribing  cipro antibiotic for the probable infection. Hydrate well. I am sending out a urine culture. During the interim if your signs and symptoms worsen rather than improving please notify us. We will notify your when the culture results are back.  For urinary pain will rx pyridium  Follow up in 7 days or as needed.

## 2019-04-06 NOTE — Progress Notes (Signed)
Subjective:    Patient ID: Lisa Patel, female    DOB: [birth year], 49 y.o.   MRN: [id number]  HPI  Pt in today reporting urinary symptoms for one week.  Dysuria- yes(last night pain lasted while after urination) Frequent urination-yes Hesitancy-no Suprapubic pressure-yes Fever-no chills-no Nausea-no Vomiting-no CVA pain- History of UTI-yes. Get uti couple of times a year. Usually hydrates well and gets over per pt. Gross hematuria-no No odor to urine   Review of Systems  Constitutional: Negative for chills and fatigue.  Respiratory: Negative for cough, chest tightness, shortness of breath and wheezing.   Cardiovascular: Negative for chest pain and palpitations.  Gastrointestinal: Negative for abdominal pain.  Genitourinary: Positive for dysuria, frequency and urgency. Negative for difficulty urinating.  Musculoskeletal: Negative for back pain, myalgias and neck stiffness.  Skin: Negative for rash.  Neurological: Negative for dizziness, speech difficulty, weakness and headaches.  Hematological: Negative for adenopathy. Does not bruise/bleed easily.  Psychiatric/Behavioral: Negative for behavioral problems and confusion.    Past Medical History:  Diagnosis Date  . Allergy    seasonal  . Diabetes mellitus   . GERD (gastroesophageal reflux disease)   . Hyperlipidemia   . Normal vaginal delivery    02-1993 and 2000 had epidural anesthesia  . Obesity   . Panic attack   . Thyroid disease   . Vitamin D deficiency      Social History   Socioeconomic History  . Marital status: Married    Spouse name: Not on file  . Number of children: 2  . Years of education: Not on file  . Highest education level: Not on file  Occupational History  . Occupation: Pharmacist, hospital  Tobacco Use  . Smoking status: Never Smoker  . Smokeless tobacco: Never Used  Substance and Sexual Activity  . Alcohol use: Not Currently  . Drug use: Never  . Sexual activity: Yes    Birth  control/protection: Surgical    Comment: husband had vasectomy  Other Topics Concern  . Not on file  Social History Narrative  . Not on file   Social Determinants of Health   Financial Resource Strain:   . Difficulty of Paying Living Expenses:   Food Insecurity:   . Worried About Charity fundraiser in the Last Year:   . Arboriculturist in the Last Year:   Transportation Needs:   . Film/video editor (Medical):   Marland Kitchen Lack of Transportation (Non-Medical):   Physical Activity:   . Days of Exercise per Week:   . Minutes of Exercise per Session:   Stress:   . Feeling of Stress :   Social Connections:   . Frequency of Communication with Friends and Family:   . Frequency of Social Gatherings with Friends and Family:   . Attends Religious Services:   . Active Member of Clubs or Organizations:   . Attends Archivist Meetings:   Marland Kitchen Marital Status:   Intimate Partner Violence:   . Fear of Current or Ex-Partner:   . Emotionally Abused:   Marland Kitchen Physically Abused:   . Sexually Abused:     Past Surgical History:  Procedure Laterality Date  . COLONOSCOPY WITH PROPOFOL N/A 01/18/2019   Procedure: COLONOSCOPY WITH PROPOFOL;  Surgeon: [name], DO;  Location: [facility];  Service: Gastroenterology;  Laterality: N/A;    Family History  Problem Relation Age of Onset  . Diabetes Maternal Grandmother   . Clotting disorder Maternal Grandmother   . Diabetes Maternal  Grandfather   . Diabetes Paternal Grandmother   . Diabetes Paternal Grandfather   . Colon cancer Neg Hx   . Esophageal cancer Neg Hx   . Colon polyps Neg Hx   . Rectal cancer Neg Hx   . Stomach cancer Neg Hx     No Known Allergies  Current Outpatient Medications on File Prior to Visit  Medication Sig Dispense Refill  . cholecalciferol (VITAMIN D3) 25 MCG (1000 UT) tablet Take 1,000 Units by mouth daily.    . citalopram (CELEXA) 20 MG tablet TAKE 1 TABLET(20 MG) BY MOUTH DAILY 90 tablet 3  . ibuprofen  (ADVIL) 200 MG tablet Take 800 mg by mouth 2 (two) times daily as needed (pain).     . Inulin (FIBER CHOICE PREBIOTIC FIBER) 1.5 g CHEW Chew 3 tablets by mouth daily.    Marland Kitchen levothyroxine (SYNTHROID) 100 MCG tablet TAKE 1 TABLET(100 MCG) BY MOUTH DAILY 90 tablet 3  . lisinopril (ZESTRIL) 5 MG tablet Take one by mouth daily 90 tablet 3  . loratadine-pseudoephedrine (CLARITIN-D 24 HOUR) 10-240 MG per 24 hr tablet Take 1 tablet by mouth daily.      . metFORMIN (GLUCOPHAGE) 500 MG tablet TAKE 1 TABLET(500 MG) BY MOUTH DAILY 90 tablet 3  . montelukast (SINGULAIR) 10 MG tablet TAKE 1 TABLET(10 MG) BY MOUTH AT BEDTIME 90 tablet 3  . Multiple Vitamin (MULTIVITAMIN) tablet Take 1 tablet by mouth daily.    Marland Kitchen omeprazole (PRILOSEC) 20 MG capsule Take 20 mg by mouth daily.    . simvastatin (ZOCOR) 20 MG tablet TAKE 1 TABLET(20 MG) BY MOUTH AT BEDTIME 90 tablet 3   No current facility-administered medications on file prior to visit.    BP (!) 154/77 (BP Location: Left Arm, Patient Position: Sitting, Cuff Size: Large)   Pulse 97   Temp (!) 97 F (36.1 C) (Temporal)   Resp 18   Ht 5\' 5"  (1.651 m)   Wt (!) 306 lb (138.8 kg)   LMP 03/17/2019 (Approximate)   SpO2 98%   BMI 50.92 kg/m       Objective:   Physical Exam  General- No acute distress. Pleasant patient. Neck- Full range of motion, no jvd Lungs- Clear, even and unlabored. Heart- regular rate and rhythm. Neurologic- CNII- XII grossly intact. Abdomen-soft, nt, nd, +bs, no rebound or guarding. No organomegaly. No suprapubic tenderness. Back- no cva tenderness.       Assessment & Plan:  You appear to have a urinary tract infection. I am prescribing  cipro antibiotic for the probable infection. Hydrate well. I am sending out a urine culture. During the interim if your signs and symptoms worsen rather than improving please notify us. We will notify your when the culture results are back.  For urinary pain will rx pyridium  Follow up in 7  days or as needed.  Mackie Pai, PA-C

## 2019-04-07 LAB — URINE CULTURE
MICRO NUMBER:: 10282326
SPECIMEN QUALITY:: ADEQUATE

## 2019-04-08 ENCOUNTER — Encounter: Payer: Self-pay | Admitting: Medical

## 2019-08-02 ENCOUNTER — Ambulatory Visit: Payer: BC Managed Care – PPO

## 2019-08-21 NOTE — Patient Instructions (Addendum)
Good to see you again today!   I will be in touch with your labs Will refill your thyroid med once your TSH comes in I have referred you to [facility] Address: [address] [place], [place] [address] Phone: [phone]  Give them a call in the next few days- I am not sure if you have to do an online seminar prior to your first appt. Let me know if you need any help here!   Please see me in about 6 months and take care    Health Maintenance, Female Adopting a healthy lifestyle and getting preventive care are important in promoting health and wellness. Ask your health care provider about:  The right schedule for you to have regular tests and exams.  Things you can do on your own to prevent diseases and keep yourself healthy. What should I know about diet, weight, and exercise? Eat a healthy diet   Eat a diet that includes plenty of vegetables, fruits, low-fat dairy products, and lean protein.  Do not eat a lot of foods that are high in solid fats, added sugars, or sodium. Maintain a healthy weight Body mass index (BMI) is used to identify weight problems. It estimates body fat based on height and weight. Your health care provider can help determine your BMI and help you achieve or maintain a healthy weight. Get regular exercise Get regular exercise. This is one of the most important things you can do for your health. Most adults should:  Exercise for at least 150 minutes each week. The exercise should increase your heart rate and make you sweat (moderate-intensity exercise).  Do strengthening exercises at least twice a week. This is in addition to the moderate-intensity exercise.  Spend less time sitting. Even light physical activity can be beneficial. Watch cholesterol and blood lipids Have your blood tested for lipids and cholesterol at 51 years of age, then have this test every 5 years. Have your cholesterol levels checked more often if:  Your lipid or cholesterol levels  are high.  You are older than 51 years of age.  You are at high risk for heart disease. What should I know about cancer screening? Depending on your health history and family history, you may need to have cancer screening at various ages. This may include screening for:  Breast cancer.  Cervical cancer.  Colorectal cancer.  Skin cancer.  Lung cancer. What should I know about heart disease, diabetes, and high blood pressure? Blood pressure and heart disease  High blood pressure causes heart disease and increases the risk of stroke. This is more likely to develop in people who have high blood pressure readings, are of African descent, or are overweight.  Have your blood pressure checked: ? Every 3-5 years if you are 33-39 years of age. ? Every year if you are 66 years old or older. Diabetes Have regular diabetes screenings. This checks your fasting blood sugar level. Have the screening done:  Once every three years after age 64 if you are at a normal weight and have a low risk for diabetes.  More often and at a younger age if you are overweight or have a high risk for diabetes. What should I know about preventing infection? Hepatitis B If you have a higher risk for hepatitis B, you should be screened for this virus. Talk with your health care provider to find out if you are at risk for hepatitis B infection. Hepatitis C Testing is recommended for:  Everyone born from [birth year] through 1967.  Anyone with known risk factors for hepatitis C. Sexually transmitted infections (STIs)  Get screened for STIs, including gonorrhea and chlamydia, if: ? You are sexually active and are younger than 51 years of age. ? You are older than 51 years of age and your health care provider tells you that you are at risk for this type of infection. ? Your sexual activity has changed since you were last screened, and you are at increased risk for chlamydia or gonorrhea. Ask your health care provider if  you are at risk.  Ask your health care provider about whether you are at high risk for HIV. Your health care provider may recommend a prescription medicine to help prevent HIV infection. If you choose to take medicine to prevent HIV, you should first get tested for HIV. You should then be tested every 3 months for as long as you are taking the medicine. Pregnancy  If you are about to stop having your period (premenopausal) and you may become pregnant, seek counseling before you get pregnant.  Take 400 to 800 micrograms (mcg) of folic acid every day if you become pregnant.  Ask for birth control (contraception) if you want to prevent pregnancy. Osteoporosis and menopause Osteoporosis is a disease in which the bones lose minerals and strength with aging. This can result in bone fractures. If you are 43 years old or older, or if you are at risk for osteoporosis and fractures, ask your health care provider if you should:  Be screened for bone loss.  Take a calcium or vitamin D supplement to lower your risk of fractures.  Be given hormone replacement therapy (HRT) to treat symptoms of menopause. Follow these instructions at home: Lifestyle  Do not use any products that contain nicotine or tobacco, such as cigarettes, e-cigarettes, and chewing tobacco. If you need help quitting, ask your health care provider.  Do not use street drugs.  Do not share needles.  Ask your health care provider for help if you need support or information about quitting drugs. Alcohol use  Do not drink alcohol if: ? Your health care provider tells you not to drink. ? You are pregnant, may be pregnant, or are planning to become pregnant.  If you drink alcohol: ? Limit how much you use to 0-1 drink a day. ? Limit intake if you are breastfeeding.  Be aware of how much alcohol is in your drink. In the [place] one drink equals one 12 oz bottle of beer (355 mL), one 5 oz glass of wine (148 mL), or one 1 oz glass of  hard liquor (44 mL). General instructions  Schedule regular health, dental, and eye exams.  Stay current with your vaccines.  Tell your health care provider if: ? You often feel depressed. ? You have ever been abused or do not feel safe at home. Summary  Adopting a healthy lifestyle and getting preventive care are important in promoting health and wellness.  Follow your health care provider's instructions about healthy diet, exercising, and getting tested or screened for diseases.  Follow your health care provider's instructions on monitoring your cholesterol and blood pressure. This information is not intended to replace advice given to you by your health care provider. Make sure you discuss any questions you have with your health care provider. Document Revised: 12/24/2017 Document Reviewed: 12/24/2017 Elsevier Patient Education  2020 [employer].

## 2019-08-21 NOTE — Progress Notes (Addendum)
[facility] [place], [place], [place] [address] [phone]  Date:  08/23/2019   Name:  Lisa Patel   DOB:  03-Aug-1970   MRN:  [id number]  PCP:  Darreld Mclean, MD    Chief Complaint: Annual Exam   History of Present Illness:  Lisa Patel is a 49 y.o. 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  Told to follow-up in 10 years she thinks covid- complete  shingrix- done Pap 2019: UTD.  She is still getting menses  Adequacy Satisfactory for evaluation endocervical/transformation zone component PRESENT.   Diagnosis NEGATIVE FOR INTRAEPITHELIAL LESIONS OR MALIGNANCY.   HPV NOT DETECTED   Comment: Normal Reference Range - NOT Detected  Material Submitted CervicoVaginal Pap [ThinPrep Imaged]     Lisa Patel has been trying to pursue bariatric surgery.  She reports some difficulty getting follow-up with [facility], apparently the person who was her coordinator has left the job.  She still would like to pursue bariatric surgery, wonders about a referral to a different office  Her daughter got married in May of this year, it was a wonderful event Her son is 40 years old, he is on the autism spectrum.  He is doing Patent attorney courses and lives at home  Patient Active Problem List   Diagnosis Date Noted  . Positive colorectal cancer screening using Cologuard test   . Diverticulosis of colon without hemorrhage   . Grade II hemorrhoids   . Numbness of foot  09/11/2011  . Hypothyroid 09/11/2011  . Diet-controlled diabetes mellitus ([place]) 10/02/2009  . Hyperlipidemia associated with type 2 diabetes mellitus ([place]) 10/02/2009  . ANXIETY STATE, UNSPECIFIED 09/04/2009  . PARESTHESIA 09/04/2009  . SNORING 09/04/2009  . VITAMIN D DEFICIENCY 08/31/2008  . HYPERKALEMIA 08/31/2008  . GERD 08/31/2008    Past Medical History:  Diagnosis Date  . Allergy    seasonal  . Diabetes mellitus   . GERD (gastroesophageal reflux disease)   . Hyperlipidemia   . Normal vaginal delivery    02-1993 and 2000 had epidural anesthesia  . Obesity   . Panic attack   . Thyroid disease   . Vitamin D deficiency     Past Surgical History:  Procedure Laterality Date  . COLONOSCOPY WITH PROPOFOL N/A 01/18/2019   Procedure: COLONOSCOPY WITH PROPOFOL;  Surgeon: [name], DO;  Location: [facility];  Service: Gastroenterology;  Laterality: N/A;    Social History   Tobacco Use  . Smoking status: Never Smoker  . Smokeless tobacco: Never Used  Vaping Use  . Vaping Use: Never used  Substance Use Topics  . Alcohol use: Not Currently  . Drug use: Never    Family History  Problem Relation Age of Onset  . Diabetes Maternal Grandmother   . Clotting disorder Maternal Grandmother   . Diabetes Maternal Grandfather   . Diabetes Paternal Grandmother   . Diabetes Paternal Grandfather   . Colon cancer Neg Hx   . Esophageal cancer Neg Hx   .  Colon polyps Neg Hx   . Rectal cancer Neg Hx   . Stomach cancer Neg Hx     No Known Allergies  Medication list has been reviewed and updated.  Current Outpatient Medications on File Prior to Visit  Medication Sig Dispense Refill  . cholecalciferol (VITAMIN D3) 25 MCG (1000 UT) tablet Take 1,000 Units by mouth daily.    . citalopram (CELEXA) 20 MG tablet TAKE 1 TABLET(20 MG) BY MOUTH DAILY 90 tablet 3  . ibuprofen (ADVIL) 200 MG tablet Take 800 mg by mouth 2 (two) times daily as needed (pain).     . Inulin (FIBER  CHOICE PREBIOTIC FIBER) 1.5 g CHEW Chew 3 tablets by mouth daily.    [place] Kitchen levothyroxine (SYNTHROID) 100 MCG tablet TAKE 1 TABLET(100 MCG) BY MOUTH DAILY 90 tablet 3  . lisinopril (ZESTRIL) 5 MG tablet Take one by mouth daily 90 tablet 3  . loratadine-pseudoephedrine (CLARITIN-D 24 HOUR) 10-240 MG per 24 hr tablet Take 1 tablet by mouth daily.      . metFORMIN (GLUCOPHAGE) 500 MG tablet TAKE 1 TABLET(500 MG) BY MOUTH DAILY 90 tablet 3  . montelukast (SINGULAIR) 10 MG tablet TAKE 1 TABLET(10 MG) BY MOUTH AT BEDTIME 90 tablet 3  . Multiple Vitamin (MULTIVITAMIN) tablet Take 1 tablet by mouth daily.    Marland Kitchen omeprazole (PRILOSEC) 20 MG capsule Take 20 mg by mouth daily.    . simvastatin (ZOCOR) 20 MG tablet TAKE 1 TABLET(20 MG) BY MOUTH AT BEDTIME 90 tablet 3   No current facility-administered medications on file prior to visit.    Review of Systems:  As per HPI- otherwise negative.   Physical Examination: Vitals:   08/23/19 1117  BP: 132/80  Pulse: 86  Resp: 18  Temp: 98.1 F (36.7 C)  SpO2: 98%   Vitals:   08/23/19 1117  Weight: (!) 301 lb (136.5 kg)  Height: 5\' 5"  (1.651 m)   Body mass index is 50.09 kg/m. Ideal Body Weight: Weight in (lb) to have BMI = 25: 149.9  GEN: no acute distress.  Obese, looks well OW HEENT: Atraumatic, Normocephalic.  Ears and Nose: No external deformity. CV: RRR, No M/G/R. No JVD. No thrill. No extra heart sounds. PULM: CTA B, no wheezes, crackles, rhonchi. No retractions. No resp. distress. No accessory muscle use. ABD: S, NT, ND, +BS. No rebound. No HSM. EXTR: No c/c/e PSYCH: Normally interactive. Conversant.    Assessment and Plan: Physical exam  Hyperlipidemia associated with type 2 diabetes mellitus ([place]) - Plan: Lipid panel, simvastatin (ZOCOR) 20 MG tablet  Controlled type 2 diabetes mellitus without complication, without long-term current use of insulin ([place]) - Plan: Comprehensive metabolic panel, Hemoglobin A1c, metFORMIN (GLUCOPHAGE)  500 MG tablet  Hypothyroidism (acquired) - Plan: TSH  Screening for deficiency anemia - Plan: CBC  Anxiety state - Plan: citalopram (CELEXA) 20 MG tablet  Diet-controlled diabetes mellitus ([place]) - Plan: lisinopril (ZESTRIL) 5 MG tablet  Expiratory wheezing - Plan: montelukast (SINGULAIR) 10 MG tablet  Morbid obesity ([place]) - Plan: Amb Referral to Bariatric Surgery  Patient today for complete physical Medications refilled, labs pending as above She is up-to-date on her immunizations and services, mammogram scheduled already Discussed her desire to complete bariatric surgery.  I will refer her for care at [facility] as she is having difficulty getting in touch with her previous practice Will plan further follow- up pending labs.  Fill thyroid once labs come in  This visit occurred during the SARS-CoV-2 public health emergency.  Safety protocols were in place, including screening questions prior to the visit, additional usage of staff PPE, and extensive cleaning of exam room while observing appropriate contact time as indicated for disinfecting solutions.    Signed Lamar Blinks, MD Received her labs as follows- message to pt  Results for orders placed or performed in visit on 08/23/19  CBC  Result Value Ref Range   WBC 8.5 4.0 - 10.5 K/uL   RBC 4.81 3.87 - 5.11 Mil/uL   Platelets 240.0 150 - 400 K/uL   Hemoglobin 13.9 12.0 - 15.0 g/dL   HCT 41.8 36 - 46 %   MCV 86.9 78.0 - 100.0 fl   MCHC 33.2 30.0 - 36.0 g/dL   RDW 13.2 11.5 - 15.5 %  Comprehensive metabolic panel  Result Value Ref Range   Sodium 135 135 - 145 mEq/L   Potassium 4.9 3.5 - 5.1 mEq/L   Chloride 100 96 - 112 mEq/L   CO2 23 19 - 32 mEq/L   Glucose, Bld 153 (H) 70 - 99 mg/dL   BUN 20 6 - 23 mg/dL   Creatinine, Ser 0.91 0.40 - 1.20 mg/dL   Total Bilirubin 0.6 0.2 - 1.2 mg/dL   Alkaline Phosphatase 79 39 - 117 U/L   AST 17 0 - 37 U/L   ALT 13 0 - 35 U/L   Total Protein 7.8 6.0 - 8.3 g/dL   Albumin 4.1  3.5 - 5.2 g/dL   GFR 65.14 >60.00 mL/min   Calcium 9.9 8.4 - 10.5 mg/dL  Hemoglobin A1c  Result Value Ref Range   Hgb A1c MFr Bld 7.3 (H) 4.6 - 6.5 %  Lipid panel  Result Value Ref Range   Cholesterol 163 0 - 200 mg/dL   Triglycerides 136.0 0 - 149 mg/dL   HDL 50.00 >39.00 mg/dL   VLDL 27.2 0.0 - 40.0 mg/dL   LDL Cholesterol 86 0 - 99 mg/dL   Total CHOL/HDL Ratio 3    NonHDL 113.48   TSH  Result Value Ref Range   TSH 4.10 0.35 - 4.50 uIU/mL   Blood counts normal Metabolic profile normal  Your A1c is a bit higher than previous . Not unreasonable, but I would like to see it lower than 7%.   Please work on diet and exercise, and we can recheck in 6 months I also refilled your thyroid medication.  Take care!

## 2019-08-23 ENCOUNTER — Encounter: Payer: Self-pay | Admitting: Family Medicine

## 2019-08-23 ENCOUNTER — Other Ambulatory Visit: Payer: Self-pay

## 2019-08-23 ENCOUNTER — Ambulatory Visit (INDEPENDENT_AMBULATORY_CARE_PROVIDER_SITE_OTHER): Payer: BC Managed Care – PPO | Admitting: Family Medicine

## 2019-08-23 VITALS — BP 132/80 | HR 86 | Temp 98.1°F | Resp 18 | Ht 65.0 in | Wt 301.0 lb

## 2019-08-23 DIAGNOSIS — Z130 Encounter for screening for diseases of the blood and blood-forming organs and certain disorders involving the immune mechanism: Secondary | ICD-10-CM | POA: Diagnosis not present

## 2019-08-23 DIAGNOSIS — E1169 Type 2 diabetes mellitus with other specified complication: Secondary | ICD-10-CM | POA: Diagnosis not present

## 2019-08-23 DIAGNOSIS — Z0000 Encounter for general adult medical examination without abnormal findings: Secondary | ICD-10-CM | POA: Diagnosis not present

## 2019-08-23 DIAGNOSIS — E119 Type 2 diabetes mellitus without complications: Secondary | ICD-10-CM

## 2019-08-23 DIAGNOSIS — E785 Hyperlipidemia, unspecified: Secondary | ICD-10-CM | POA: Diagnosis not present

## 2019-08-23 DIAGNOSIS — R062 Wheezing: Secondary | ICD-10-CM

## 2019-08-23 DIAGNOSIS — E039 Hypothyroidism, unspecified: Secondary | ICD-10-CM

## 2019-08-23 DIAGNOSIS — F411 Generalized anxiety disorder: Secondary | ICD-10-CM

## 2019-08-23 LAB — COMPREHENSIVE METABOLIC PANEL
ALT: 13 U/L (ref 0–35)
AST: 17 U/L (ref 0–37)
Albumin: 4.1 g/dL (ref 3.5–5.2)
Alkaline Phosphatase: 79 U/L (ref 39–117)
BUN: 20 mg/dL (ref 6–23)
CO2: 23 mEq/L (ref 19–32)
Calcium: 9.9 mg/dL (ref 8.4–10.5)
Chloride: 100 mEq/L (ref 96–112)
Creatinine, Ser: 0.91 mg/dL (ref 0.40–1.20)
GFR: 65.14 mL/min (ref >60.00)
Glucose, Bld: 153 mg/dL — ABNORMAL HIGH (ref 70–99)
Potassium: 4.9 mEq/L (ref 3.5–5.1)
Sodium: 135 mEq/L (ref 135–145)
Total Bilirubin: 0.6 mg/dL (ref 0.2–1.2)
Total Protein: 7.8 g/dL (ref 6.0–8.3)

## 2019-08-23 LAB — LIPID PANEL
Cholesterol: 163 mg/dL (ref 0–200)
HDL: 50 mg/dL (ref >39.00)
LDL Cholesterol: 86 mg/dL (ref 0–99)
NonHDL: 113.48
Total CHOL/HDL Ratio: 3
Triglycerides: 136 mg/dL (ref 0.0–149.0)
VLDL: 27.2 mg/dL (ref 0.0–40.0)

## 2019-08-23 LAB — CBC
HCT: 41.8 % (ref 36.0–46.0)
Hemoglobin: 13.9 g/dL (ref 12.0–15.0)
MCHC: 33.2 g/dL (ref 30.0–36.0)
MCV: 86.9 fl (ref 78.0–100.0)
Platelets: 240 10*3/uL (ref 150.0–400.0)
RBC: 4.81 Mil/uL (ref 3.87–5.11)
RDW: 13.2 % (ref 11.5–15.5)
WBC: 8.5 10*3/uL (ref 4.0–10.5)

## 2019-08-23 LAB — HEMOGLOBIN A1C: Hgb A1c MFr Bld: 7.3 % — ABNORMAL HIGH (ref 4.6–6.5)

## 2019-08-23 LAB — TSH: TSH: 4.1 u[IU]/mL (ref 0.35–4.50)

## 2019-08-23 MED ORDER — MONTELUKAST SODIUM 10 MG PO TABS: ORAL_TABLET | ORAL | 3 refills | Status: DC

## 2019-08-23 MED ORDER — SIMVASTATIN 20 MG PO TABS: ORAL_TABLET | ORAL | 3 refills | Status: DC

## 2019-08-23 MED ORDER — LISINOPRIL 5 MG PO TABS: ORAL_TABLET | ORAL | 3 refills | Status: DC

## 2019-08-23 MED ORDER — CITALOPRAM HYDROBROMIDE 20 MG PO TABS: ORAL_TABLET | ORAL | 3 refills | Status: DC

## 2019-08-23 MED ORDER — LEVOTHYROXINE SODIUM 100 MCG PO TABS: ORAL_TABLET | ORAL | 3 refills | Status: DC

## 2019-08-23 MED ORDER — METFORMIN HCL 500 MG PO TABS: ORAL_TABLET | ORAL | 3 refills | Status: DC

## 2019-08-23 NOTE — Addendum Note (Signed)
Addended by: Lamar Blinks C on: 08/23/2019 06:42 PM   Modules accepted: Orders

## 2019-08-25 ENCOUNTER — Encounter: Payer: Self-pay | Admitting: Family Medicine

## 2019-09-12 ENCOUNTER — Encounter: Payer: Self-pay | Admitting: Family Medicine

## 2019-10-12 ENCOUNTER — Ambulatory Visit (INDEPENDENT_AMBULATORY_CARE_PROVIDER_SITE_OTHER): Payer: BC Managed Care – PPO | Admitting: Psychology

## 2019-10-12 DIAGNOSIS — F509 Eating disorder, unspecified: Secondary | ICD-10-CM | POA: Diagnosis not present

## 2019-11-15 ENCOUNTER — Ambulatory Visit: Payer: BC Managed Care – PPO

## 2019-11-22 ENCOUNTER — Other Ambulatory Visit: Payer: Self-pay

## 2019-11-22 ENCOUNTER — Encounter: Payer: BC Managed Care – PPO | Attending: General Surgery | Admitting: Skilled Nursing Facility1

## 2019-11-22 DIAGNOSIS — E669 Obesity, unspecified: Secondary | ICD-10-CM | POA: Insufficient documentation

## 2019-11-23 NOTE — Progress Notes (Signed)
Pre-Operative Nutrition Class:  Appt start time: 4585   End time:  1830.  Patient was seen on 11/22/2019 for Pre-Operative Bariatric Surgery Education at the [facility].    Surgery date:  Surgery type: RYGB Start weight at [facility]: 310.1lb Weight today: 305.7 lb Samples given per MNT protocol. Patient educated on appropriate usage: Multivitamin- procare Lot [id number] Exp:01/2021   Calcium - celebrate Lot #[id number] Exp:04/27/2021   Protein 20  Shake Lot #[id number] Exp: 10/20/2020   The following the learning objectives were met by the patient during this course:  Identify Pre-Op Dietary Goals and will begin 2 weeks pre-operatively  Identify appropriate sources of fluids and proteins   State protein recommendations and appropriate sources pre and post-operatively  Identify Post-Operative Dietary Goals and will follow for 2 weeks post-operatively  Identify appropriate multivitamin and calcium sources  Describe the need for physical activity post-operatively and will follow MD recommendations  State when to call healthcare provider regarding medication questions or post-operative complications  Handouts given during class include:  Pre-Op Bariatric Surgery Diet Handout  Protein Shake Handout  Post-Op Bariatric Surgery Nutrition Handout  [facility]EL[facility] Program Information Flyer  Support Group Information Flyer  [facility] Bariatric Supplements Price List  Follow-Up Plan: Patient will follow-up at [facility] 2 weeks post operatively for diet advancement per MD.

## 2019-12-20 ENCOUNTER — Encounter: Payer: Self-pay | Admitting: Family Medicine

## 2019-12-21 ENCOUNTER — Ambulatory Visit: Payer: Self-pay | Admitting: General Surgery

## 2019-12-21 NOTE — Progress Notes (Addendum)
COVID Vaccine Completed: yes Date COVID Vaccine completed:x3 COVID vaccine manufacturer: Pfizer       PCP - Darreld Mclean, MD last office visit 08/23/19 Cardiologist -  N/A  Chest x-ray - 03/31/19 in epic EKG - 03/31/19 in epic Stress Test - greater than 2 years ECHO - N/A Cardiac Cath -  N/A Pacemaker/ICD device last checked: N/A  Sleep Study -  N/A CPAP -  N/A  Fasting Blood Sugar -  N/A Checks Blood Sugar __ N/A___ times a day  Blood Thinner Instructions:  N/A Aspirin Instructions:  N/A Last Dose:  N/A  Activity level: Can go up a flight of stairs without stopping and without symptoms       Anesthesia review: N/A  Patient denies shortness of breath, fever, cough and chest pain at PAT appointment   Patient verbalized understanding of instructions that were given to them at the PAT appointment. Patient was also instructed that they will need to review over the PAT instructions again at home before surgery.

## 2019-12-23 ENCOUNTER — Encounter (HOSPITAL_COMMUNITY): Payer: Self-pay | Admitting: General Surgery

## 2019-12-23 ENCOUNTER — Other Ambulatory Visit (HOSPITAL_COMMUNITY)
Admission: RE | Admit: 2019-12-23 | Discharge: 2019-12-23 | Disposition: A | Discharge status: Home / Self Care | Payer: BC Managed Care – PPO | Source: Ambulatory Visit | Attending: General Surgery | Admitting: General Surgery

## 2019-12-23 ENCOUNTER — Other Ambulatory Visit: Payer: Self-pay

## 2019-12-23 DIAGNOSIS — Z20822 Contact with and (suspected) exposure to covid-19: Secondary | ICD-10-CM | POA: Insufficient documentation

## 2019-12-23 DIAGNOSIS — Z01812 Encounter for preprocedural laboratory examination: Secondary | ICD-10-CM | POA: Diagnosis not present

## 2019-12-23 LAB — SARS CORONAVIRUS 2 (TAT 6-24 HRS): SARS Coronavirus 2: NEGATIVE

## 2019-12-23 NOTE — Progress Notes (Signed)
   12/23/19 1041  OBSTRUCTIVE SLEEP APNEA  Have you ever been diagnosed with sleep apnea through a sleep study? No  Do you snore loudly (loud enough to be heard through closed doors)?  1  Do you often feel tired, fatigued, or sleepy during the daytime (such as falling asleep during driving or talking to someone)? 0  Has anyone observed you stop breathing during your sleep? 1  Do you have, or are you being treated for high blood pressure? 1  BMI more than 35 kg/m2? 1  Age > 50 (1-yes) 1  Neck circumference greater than:Female 16 inches or larger, Female 17inches or larger? 0  Female Gender (Yes=1) 0  Obstructive Sleep Apnea Score 5  Score 5 or greater  Results sent to PCP

## 2019-12-26 MED ORDER — BUPIVACAINE LIPOSOME 1.3 % IJ SUSP
20.0000 mL | Freq: Once | INTRAMUSCULAR | Status: DC
  Filled 2019-12-26: qty 20

## 2019-12-27 ENCOUNTER — Encounter (HOSPITAL_COMMUNITY): Payer: Self-pay | Admitting: General Surgery

## 2019-12-27 ENCOUNTER — Encounter (HOSPITAL_COMMUNITY)
Admission: RE | Disposition: A | Discharge status: Home / Self Care | Payer: Self-pay | Source: Home / Self Care | Attending: General Surgery

## 2019-12-27 ENCOUNTER — Inpatient Hospital Stay (HOSPITAL_COMMUNITY)
Admission: RE | Admit: 2019-12-27 | Discharge: 2019-12-30 | DRG: 621 | Disposition: A | Discharge status: Home / Self Care | Payer: BC Managed Care – PPO | Attending: General Surgery | Admitting: General Surgery

## 2019-12-27 ENCOUNTER — Inpatient Hospital Stay (HOSPITAL_COMMUNITY): Payer: BC Managed Care – PPO | Admitting: Certified Registered"

## 2019-12-27 ENCOUNTER — Other Ambulatory Visit: Payer: Self-pay

## 2019-12-27 DIAGNOSIS — Z79899 Other long term (current) drug therapy: Secondary | ICD-10-CM

## 2019-12-27 DIAGNOSIS — Z833 Family history of diabetes mellitus: Secondary | ICD-10-CM

## 2019-12-27 DIAGNOSIS — Z7984 Long term (current) use of oral hypoglycemic drugs: Secondary | ICD-10-CM | POA: Diagnosis not present

## 2019-12-27 DIAGNOSIS — E559 Vitamin D deficiency, unspecified: Secondary | ICD-10-CM | POA: Diagnosis present

## 2019-12-27 DIAGNOSIS — Z794 Long term (current) use of insulin: Secondary | ICD-10-CM | POA: Diagnosis not present

## 2019-12-27 DIAGNOSIS — E785 Hyperlipidemia, unspecified: Secondary | ICD-10-CM | POA: Diagnosis present

## 2019-12-27 DIAGNOSIS — K219 Gastro-esophageal reflux disease without esophagitis: Secondary | ICD-10-CM | POA: Diagnosis present

## 2019-12-27 DIAGNOSIS — J302 Other seasonal allergic rhinitis: Secondary | ICD-10-CM | POA: Diagnosis present

## 2019-12-27 DIAGNOSIS — K5730 Diverticulosis of large intestine without perforation or abscess without bleeding: Secondary | ICD-10-CM | POA: Diagnosis present

## 2019-12-27 DIAGNOSIS — Z6841 Body Mass Index (BMI) 40.0 and over, adult: Secondary | ICD-10-CM

## 2019-12-27 DIAGNOSIS — F410 Panic disorder [episodic paroxysmal anxiety] without agoraphobia: Secondary | ICD-10-CM | POA: Diagnosis present

## 2019-12-27 DIAGNOSIS — E1169 Type 2 diabetes mellitus with other specified complication: Secondary | ICD-10-CM | POA: Diagnosis present

## 2019-12-27 DIAGNOSIS — R112 Nausea with vomiting, unspecified: Secondary | ICD-10-CM | POA: Diagnosis not present

## 2019-12-27 DIAGNOSIS — I10 Essential (primary) hypertension: Secondary | ICD-10-CM | POA: Diagnosis present

## 2019-12-27 DIAGNOSIS — E039 Hypothyroidism, unspecified: Secondary | ICD-10-CM | POA: Diagnosis present

## 2019-12-27 HISTORY — PX: UPPER GI ENDOSCOPY: SHX6162

## 2019-12-27 HISTORY — DX: Hypothyroidism, unspecified: E03.9

## 2019-12-27 HISTORY — DX: Essential (primary) hypertension: I10

## 2019-12-27 HISTORY — DX: Other seasonal allergic rhinitis: J30.2

## 2019-12-27 HISTORY — PX: GASTRIC ROUX-EN-Y: SHX5262

## 2019-12-27 LAB — PREGNANCY, URINE: Preg Test, Ur: NEGATIVE

## 2019-12-27 LAB — CBC WITH DIFFERENTIAL/PLATELET
Abs Immature Granulocytes: 0.02 10*3/uL (ref 0.00–0.07)
Basophils Absolute: 0 10*3/uL (ref 0.0–0.1)
Basophils Relative: 0 %
Eosinophils Absolute: 0.1 10*3/uL (ref 0.0–0.5)
Eosinophils Relative: 1 %
HCT: 39.7 % (ref 36.0–46.0)
Hemoglobin: 13.1 g/dL (ref 12.0–15.0)
Immature Granulocytes: 0 %
Lymphocytes Relative: 19 %
Lymphs Abs: 1.4 10*3/uL (ref 0.7–4.0)
MCH: 29.2 pg (ref 26.0–34.0)
MCHC: 33 g/dL (ref 30.0–36.0)
MCV: 88.6 fL (ref 80.0–100.0)
Monocytes Absolute: 0.4 10*3/uL (ref 0.1–1.0)
Monocytes Relative: 6 %
Neutro Abs: 5.4 10*3/uL (ref 1.7–7.7)
Neutrophils Relative %: 74 %
Platelets: 186 10*3/uL (ref 150–400)
RBC: 4.48 MIL/uL (ref 3.87–5.11)
RDW: 13.1 % (ref 11.5–15.5)
WBC: 7.4 10*3/uL (ref 4.0–10.5)
nRBC: 0 % (ref 0.0–0.2)

## 2019-12-27 LAB — COMPREHENSIVE METABOLIC PANEL
ALT: 19 U/L (ref 0–44)
AST: 25 U/L (ref 15–41)
Albumin: 4 g/dL (ref 3.5–5.0)
Alkaline Phosphatase: 52 U/L (ref 38–126)
Anion gap: 11 (ref 5–15)
BUN: 23 mg/dL — ABNORMAL HIGH (ref 6–20)
CO2: 24 mmol/L (ref 22–32)
Calcium: 9.3 mg/dL (ref 8.9–10.3)
Chloride: 100 mmol/L (ref 98–111)
Creatinine, Ser: 0.78 mg/dL (ref 0.44–1.00)
GFR, Estimated: >60 mL/min (ref >60)
Glucose, Bld: 131 mg/dL — ABNORMAL HIGH (ref 70–99)
Potassium: 4.4 mmol/L (ref 3.5–5.1)
Sodium: 135 mmol/L (ref 135–145)
Total Bilirubin: 0.7 mg/dL (ref 0.3–1.2)
Total Protein: 7.3 g/dL (ref 6.5–8.1)

## 2019-12-27 LAB — HEMOGLOBIN AND HEMATOCRIT, BLOOD
HCT: 39.7 % (ref 36.0–46.0)
Hemoglobin: 13 g/dL (ref 12.0–15.0)

## 2019-12-27 LAB — TYPE AND SCREEN
ABO/RH(D): A POS
Antibody Screen: NEGATIVE

## 2019-12-27 LAB — ABO/RH: ABO/RH(D): A POS

## 2019-12-27 LAB — GLUCOSE, CAPILLARY
Glucose-Capillary: 129 mg/dL — ABNORMAL HIGH (ref 70–99)
Glucose-Capillary: 193 mg/dL — ABNORMAL HIGH (ref 70–99)
Glucose-Capillary: 195 mg/dL — ABNORMAL HIGH (ref 70–99)
Glucose-Capillary: 215 mg/dL — ABNORMAL HIGH (ref 70–99)

## 2019-12-27 LAB — HEMOGLOBIN A1C
Hgb A1c MFr Bld: 6.8 % — ABNORMAL HIGH (ref 4.8–5.6)
Mean Plasma Glucose: 148.46 mg/dL

## 2019-12-27 SURGERY — LAPAROSCOPIC ROUX-EN-Y GASTRIC BYPASS WITH UPPER ENDOSCOPY
Anesthesia: General | Site: Abdomen

## 2019-12-27 MED ORDER — PHENYLEPHRINE 40 MCG/ML (10ML) SYRINGE FOR IV PUSH (FOR BLOOD PRESSURE SUPPORT)
PREFILLED_SYRINGE | INTRAVENOUS | Status: DC | PRN
  Administered 2019-12-27: 80 ug via INTRAVENOUS
  Administered 2019-12-27 (×2): 40 ug via INTRAVENOUS

## 2019-12-27 MED ORDER — ENOXAPARIN SODIUM 30 MG/0.3ML ~~LOC~~ SOLN
30.0000 mg | Freq: Two times a day (BID) | SUBCUTANEOUS | Status: DC
  Administered 2019-12-27 – 2019-12-30 (×6): 30 mg via SUBCUTANEOUS
  Filled 2019-12-27 (×6): qty 0.3

## 2019-12-27 MED ORDER — CHLORHEXIDINE GLUCONATE CLOTH 2 % EX PADS: 6.0000 | MEDICATED_PAD | Freq: Once | CUTANEOUS | Status: DC

## 2019-12-27 MED ORDER — KETAMINE HCL 10 MG/ML IJ SOLN
INTRAMUSCULAR | Status: AC
  Filled 2019-12-27: qty 1

## 2019-12-27 MED ORDER — FENTANYL CITRATE (PF) 100 MCG/2ML IJ SOLN
INTRAMUSCULAR | Status: DC | PRN
  Administered 2019-12-27 (×4): 50 ug via INTRAVENOUS

## 2019-12-27 MED ORDER — SUCCINYLCHOLINE CHLORIDE 200 MG/10ML IV SOSY
PREFILLED_SYRINGE | INTRAVENOUS | Status: DC | PRN
  Administered 2019-12-27: 140 mg via INTRAVENOUS

## 2019-12-27 MED ORDER — DEXAMETHASONE SODIUM PHOSPHATE 10 MG/ML IJ SOLN
INTRAMUSCULAR | Status: AC
  Filled 2019-12-27: qty 1

## 2019-12-27 MED ORDER — EPHEDRINE 5 MG/ML INJ
INTRAVENOUS | Status: AC
  Filled 2019-12-27: qty 10

## 2019-12-27 MED ORDER — ONDANSETRON HCL 4 MG/2ML IJ SOLN
INTRAMUSCULAR | Status: AC
  Filled 2019-12-27: qty 2

## 2019-12-27 MED ORDER — LEVOTHYROXINE SODIUM 100 MCG PO TABS
100.0000 ug | ORAL_TABLET | Freq: Every day | ORAL | Status: DC
  Administered 2019-12-28 – 2019-12-30 (×3): 100 ug via ORAL
  Filled 2019-12-27 (×3): qty 1

## 2019-12-27 MED ORDER — ACETAMINOPHEN 160 MG/5ML PO SOLN
1000.0000 mg | Freq: Three times a day (TID) | ORAL | Status: DC
  Administered 2019-12-28: 14:00:00 1000 mg via ORAL
  Filled 2019-12-27 (×2): qty 40.6

## 2019-12-27 MED ORDER — FENTANYL CITRATE (PF) 100 MCG/2ML IJ SOLN
INTRAMUSCULAR | Status: AC
  Filled 2019-12-27: qty 2

## 2019-12-27 MED ORDER — APREPITANT 40 MG PO CAPS
40.0000 mg | ORAL_CAPSULE | ORAL | Status: AC
  Administered 2019-12-27: 09:00:00 40 mg via ORAL
  Filled 2019-12-27: qty 1

## 2019-12-27 MED ORDER — DEXAMETHASONE SODIUM PHOSPHATE 4 MG/ML IJ SOLN: 4.0000 mg | INTRAMUSCULAR | Status: DC

## 2019-12-27 MED ORDER — FAMOTIDINE IN NACL 20-0.9 MG/50ML-% IV SOLN
20.0000 mg | Freq: Two times a day (BID) | INTRAVENOUS | Status: DC
  Administered 2019-12-27 – 2019-12-30 (×6): 20 mg via INTRAVENOUS
  Filled 2019-12-27 (×6): qty 50

## 2019-12-27 MED ORDER — OXYCODONE HCL 5 MG PO TABS: 5.0000 mg | ORAL_TABLET | Freq: Once | ORAL | Status: DC | PRN

## 2019-12-27 MED ORDER — ACETAMINOPHEN 500 MG PO TABS
1000.0000 mg | ORAL_TABLET | ORAL | Status: AC
  Administered 2019-12-27: 09:00:00 1000 mg via ORAL
  Filled 2019-12-27: qty 2

## 2019-12-27 MED ORDER — KETAMINE HCL 10 MG/ML IJ SOLN
INTRAMUSCULAR | Status: DC | PRN
  Administered 2019-12-27 (×3): 10 mg via INTRAVENOUS

## 2019-12-27 MED ORDER — ORAL CARE MOUTH RINSE: 15.0000 mL | Freq: Once | OROMUCOSAL | Status: AC

## 2019-12-27 MED ORDER — DEXTROSE-NACL 5-0.45 % IV SOLN: INTRAVENOUS | Status: DC

## 2019-12-27 MED ORDER — ACETAMINOPHEN 500 MG PO TABS
1000.0000 mg | ORAL_TABLET | Freq: Three times a day (TID) | ORAL | Status: DC
  Administered 2019-12-27 – 2019-12-30 (×7): 1000 mg via ORAL
  Filled 2019-12-27 (×6): qty 2

## 2019-12-27 MED ORDER — ALBUMIN HUMAN 5 % IV SOLN: INTRAVENOUS | Status: DC | PRN

## 2019-12-27 MED ORDER — GABAPENTIN 300 MG PO CAPS
300.0000 mg | ORAL_CAPSULE | ORAL | Status: AC
  Administered 2019-12-27: 09:00:00 300 mg via ORAL
  Filled 2019-12-27: qty 1

## 2019-12-27 MED ORDER — ROCURONIUM BROMIDE 10 MG/ML (PF) SYRINGE
PREFILLED_SYRINGE | INTRAVENOUS | Status: AC
  Filled 2019-12-27: qty 10

## 2019-12-27 MED ORDER — ONDANSETRON HCL 4 MG/2ML IJ SOLN
4.0000 mg | Freq: Once | INTRAMUSCULAR | Status: AC | PRN
  Administered 2019-12-27: 13:00:00 4 mg via INTRAVENOUS

## 2019-12-27 MED ORDER — ROCURONIUM BROMIDE 100 MG/10ML IV SOLN
INTRAVENOUS | Status: DC | PRN
  Administered 2019-12-27: 80 mg via INTRAVENOUS
  Administered 2019-12-27: 10 mg via INTRAVENOUS

## 2019-12-27 MED ORDER — MORPHINE SULFATE (PF) 2 MG/ML IV SOLN
1.0000 mg | INTRAVENOUS | Status: DC | PRN
  Administered 2019-12-28: 2 mg via INTRAVENOUS
  Filled 2019-12-27: qty 1

## 2019-12-27 MED ORDER — CHLORHEXIDINE GLUCONATE 0.12 % MT SOLN
15.0000 mL | Freq: Once | OROMUCOSAL | Status: AC
  Administered 2019-12-27: 09:00:00 15 mL via OROMUCOSAL

## 2019-12-27 MED ORDER — MIDAZOLAM HCL 2 MG/2ML IJ SOLN
INTRAMUSCULAR | Status: AC
  Filled 2019-12-27: qty 2

## 2019-12-27 MED ORDER — SIMETHICONE 80 MG PO CHEW
80.0000 mg | CHEWABLE_TABLET | Freq: Four times a day (QID) | ORAL | Status: DC | PRN
  Administered 2019-12-29: 22:00:00 80 mg via ORAL
  Filled 2019-12-27: qty 1

## 2019-12-27 MED ORDER — FAMOTIDINE IN NACL 20-0.9 MG/50ML-% IV SOLN: 20.0000 mg | Freq: Two times a day (BID) | INTRAVENOUS | Status: DC

## 2019-12-27 MED ORDER — PROPOFOL 10 MG/ML IV BOLUS
INTRAVENOUS | Status: DC | PRN
  Administered 2019-12-27: 170 mg via INTRAVENOUS

## 2019-12-27 MED ORDER — HEPARIN SODIUM (PORCINE) 5000 UNIT/ML IJ SOLN
5000.0000 [IU] | INTRAMUSCULAR | Status: AC
  Administered 2019-12-27: 09:00:00 5000 [IU] via SUBCUTANEOUS
  Filled 2019-12-27: qty 1

## 2019-12-27 MED ORDER — HYDRALAZINE HCL 10 MG PO TABS
10.0000 mg | ORAL_TABLET | Freq: Three times a day (TID) | ORAL | Status: DC | PRN
  Filled 2019-12-27: qty 1

## 2019-12-27 MED ORDER — SCOPOLAMINE 1 MG/3DAYS TD PT72
1.0000 | MEDICATED_PATCH | TRANSDERMAL | Status: AC
  Administered 2019-12-27: 09:00:00 1.5 mg via TRANSDERMAL
  Filled 2019-12-27: qty 1

## 2019-12-27 MED ORDER — OXYCODONE HCL 5 MG/5ML PO SOLN
5.0000 mg | Freq: Four times a day (QID) | ORAL | Status: DC | PRN
  Administered 2019-12-27 – 2019-12-29 (×5): 5 mg via ORAL
  Filled 2019-12-27 (×5): qty 5

## 2019-12-27 MED ORDER — LACTATED RINGERS IR SOLN
Status: DC | PRN
  Administered 2019-12-27: 1000 mL

## 2019-12-27 MED ORDER — EPHEDRINE SULFATE-NACL 50-0.9 MG/10ML-% IV SOSY
PREFILLED_SYRINGE | INTRAVENOUS | Status: DC | PRN
  Administered 2019-12-27: 5 mg via INTRAVENOUS
  Administered 2019-12-27: 15 mg via INTRAVENOUS
  Administered 2019-12-27: 5 mg via INTRAVENOUS
  Administered 2019-12-27 (×2): 10 mg via INTRAVENOUS

## 2019-12-27 MED ORDER — ENSURE MAX PROTEIN PO LIQD
2.0000 [oz_av] | ORAL | Status: DC
  Administered 2019-12-28 – 2019-12-30 (×21): 2 [oz_av] via ORAL

## 2019-12-27 MED ORDER — BUPIVACAINE-EPINEPHRINE (PF) 0.25% -1:200000 IJ SOLN
INTRAMUSCULAR | Status: AC
  Filled 2019-12-27: qty 30

## 2019-12-27 MED ORDER — DEXAMETHASONE SODIUM PHOSPHATE 10 MG/ML IJ SOLN
INTRAMUSCULAR | Status: DC | PRN
  Administered 2019-12-27: 5 mg via INTRAVENOUS

## 2019-12-27 MED ORDER — MEPERIDINE HCL 50 MG/ML IJ SOLN: 6.2500 mg | INTRAMUSCULAR | Status: DC | PRN

## 2019-12-27 MED ORDER — SUGAMMADEX SODIUM 200 MG/2ML IV SOLN
INTRAVENOUS | Status: DC | PRN
  Administered 2019-12-27: 300 mg via INTRAVENOUS

## 2019-12-27 MED ORDER — SODIUM CHLORIDE 0.9 % IV SOLN
2.0000 g | INTRAVENOUS | Status: AC
  Administered 2019-12-27: 11:00:00 2 g via INTRAVENOUS
  Filled 2019-12-27: qty 2

## 2019-12-27 MED ORDER — MIDAZOLAM HCL 5 MG/5ML IJ SOLN
INTRAMUSCULAR | Status: DC | PRN
  Administered 2019-12-27: 2 mg via INTRAVENOUS

## 2019-12-27 MED ORDER — PHENYLEPHRINE HCL-NACL 10-0.9 MG/250ML-% IV SOLN: INTRAVENOUS | Status: DC | PRN

## 2019-12-27 MED ORDER — FENTANYL CITRATE (PF) 100 MCG/2ML IJ SOLN
25.0000 ug | INTRAMUSCULAR | Status: DC | PRN
  Administered 2019-12-27: 50 ug via INTRAVENOUS

## 2019-12-27 MED ORDER — LIDOCAINE 2% (20 MG/ML) 5 ML SYRINGE
INTRAMUSCULAR | Status: DC | PRN
  Administered 2019-12-27: 100 mg via INTRAVENOUS

## 2019-12-27 MED ORDER — DEXMEDETOMIDINE (PRECEDEX) IN NS 20 MCG/5ML (4 MCG/ML) IV SYRINGE
PREFILLED_SYRINGE | INTRAVENOUS | Status: DC | PRN
  Administered 2019-12-27 (×5): 4 ug via INTRAVENOUS

## 2019-12-27 MED ORDER — CITALOPRAM HYDROBROMIDE 20 MG PO TABS
20.0000 mg | ORAL_TABLET | Freq: Every day | ORAL | Status: DC
Start: 2019-12-28 — End: 2019-12-30
  Administered 2019-12-28 – 2019-12-30 (×3): 20 mg via ORAL
  Filled 2019-12-27 (×3): qty 1

## 2019-12-27 MED ORDER — PROPOFOL 10 MG/ML IV BOLUS
INTRAVENOUS | Status: AC
  Filled 2019-12-27: qty 20

## 2019-12-27 MED ORDER — LACTATED RINGERS IV SOLN: INTRAVENOUS | Status: DC

## 2019-12-27 MED ORDER — PROPOFOL 500 MG/50ML IV EMUL
INTRAVENOUS | Status: DC | PRN
  Administered 2019-12-27: 25 ug/kg/min via INTRAVENOUS

## 2019-12-27 MED ORDER — ONDANSETRON HCL 4 MG/2ML IJ SOLN
4.0000 mg | INTRAMUSCULAR | Status: DC | PRN
  Administered 2019-12-28 – 2019-12-29 (×4): 4 mg via INTRAVENOUS
  Filled 2019-12-27 (×4): qty 2

## 2019-12-27 MED ORDER — OXYCODONE HCL 5 MG/5ML PO SOLN: 5.0000 mg | Freq: Once | ORAL | Status: DC | PRN

## 2019-12-27 MED ORDER — BUPIVACAINE HCL 0.25 % IJ SOLN
INTRAMUSCULAR | Status: DC | PRN
  Administered 2019-12-27: 30 mL

## 2019-12-27 MED ORDER — LIDOCAINE HCL (PF) 2 % IJ SOLN
INTRAMUSCULAR | Status: AC
  Filled 2019-12-27: qty 5

## 2019-12-27 MED ORDER — FENTANYL CITRATE (PF) 250 MCG/5ML IJ SOLN
INTRAMUSCULAR | Status: AC
  Filled 2019-12-27: qty 5

## 2019-12-27 MED ORDER — ONDANSETRON HCL 4 MG/2ML IJ SOLN
INTRAMUSCULAR | Status: DC | PRN
  Administered 2019-12-27 (×2): 4 mg via INTRAVENOUS

## 2019-12-27 MED ORDER — 0.9 % SODIUM CHLORIDE (POUR BTL) OPTIME
TOPICAL | Status: DC | PRN
  Administered 2019-12-27: 13:00:00 1000 mL

## 2019-12-27 MED ORDER — INSULIN ASPART 100 UNIT/ML ~~LOC~~ SOLN
0.0000 [IU] | SUBCUTANEOUS | Status: DC
  Administered 2019-12-27: 20:00:00 7 [IU] via SUBCUTANEOUS
  Administered 2019-12-27: 16:00:00 4 [IU] via SUBCUTANEOUS
  Administered 2019-12-27 – 2019-12-28 (×3): 3 [IU] via SUBCUTANEOUS
  Administered 2019-12-28: 17:00:00 4 [IU] via SUBCUTANEOUS
  Administered 2019-12-28: 05:00:00 3 [IU] via SUBCUTANEOUS
  Administered 2019-12-29: 4 [IU] via SUBCUTANEOUS
  Administered 2019-12-29 (×2): 3 [IU] via SUBCUTANEOUS
  Administered 2019-12-29: 09:00:00 4 [IU] via SUBCUTANEOUS
  Administered 2019-12-29: 16:00:00 1 [IU] via SUBCUTANEOUS
  Administered 2019-12-29 – 2019-12-30 (×4): 3 [IU] via SUBCUTANEOUS

## 2019-12-27 MED ORDER — BUPIVACAINE LIPOSOME 1.3 % IJ SUSP
INTRAMUSCULAR | Status: DC | PRN
  Administered 2019-12-27: 20 mL

## 2019-12-27 SURGICAL SUPPLY — 67 items
APPLIER CLIP 5 13 M/L LIGAMAX5 (MISCELLANEOUS)
APPLIER CLIP ROT 10 11.4 M/L (STAPLE)
APPLIER CLIP ROT 13.4 12 LRG (CLIP)
BENZOIN TINCTURE PRP APPL 2/3 (GAUZE/BANDAGES/DRESSINGS) ×4 IMPLANT
BLADE SURG SZ11 CARB STEEL (BLADE) ×4 IMPLANT
BNDG ADH 1X3 SHEER STRL LF (GAUZE/BANDAGES/DRESSINGS) ×24 IMPLANT
CABLE HIGH FREQUENCY MONO STRZ (ELECTRODE) ×4 IMPLANT
CHLORAPREP W/TINT 26 (MISCELLANEOUS) ×4 IMPLANT
CLIP APPLIE 5 13 M/L LIGAMAX5 (MISCELLANEOUS) IMPLANT
CLIP APPLIE ROT 10 11.4 M/L (STAPLE) IMPLANT
CLIP APPLIE ROT 13.4 12 LRG (CLIP) IMPLANT
CLOSURE WOUND 1/2 X4 (GAUZE/BANDAGES/DRESSINGS) ×1
COVER SURGICAL LIGHT HANDLE (MISCELLANEOUS) ×4 IMPLANT
COVER WAND RF STERILE (DRAPES) ×4 IMPLANT
DEVICE SUTURE ENDOST 10MM (ENDOMECHANICALS) ×4 IMPLANT
DRAIN CHANNEL 19F RND (DRAIN) IMPLANT
DRAIN PENROSE 0.25X18 (DRAIN) ×4 IMPLANT
ELECT L-HOOK LAP 45CM DISP (ELECTROSURGICAL) ×4
ELECTRODE L-HOOK LAP 45CM DISP (ELECTROSURGICAL) ×2 IMPLANT
EVACUATOR SILICONE 100CC (DRAIN) IMPLANT
GAUZE 4X4 16PLY RFD (DISPOSABLE) ×4 IMPLANT
GAUZE SPONGE 4X4 12PLY STRL (GAUZE/BANDAGES/DRESSINGS) IMPLANT
GLOVE BIOGEL PI IND STRL 7.0 (GLOVE) ×2 IMPLANT
GLOVE BIOGEL PI INDICATOR 7.0 (GLOVE) ×2
GLOVE SURG SS PI 7.0 STRL IVOR (GLOVE) ×4 IMPLANT
GOWN STRL REUS W/TWL LRG LVL3 (GOWN DISPOSABLE) ×4 IMPLANT
GOWN STRL REUS W/TWL XL LVL3 (GOWN DISPOSABLE) ×12 IMPLANT
GRASPER SUT TROCAR 14GX15 (MISCELLANEOUS) IMPLANT
HANDLE STAPLE EGIA 4 XL (STAPLE) ×4 IMPLANT
KIT BASIN OR (CUSTOM PROCEDURE TRAY) ×4 IMPLANT
KIT GASTRIC LAVAGE 34FR ADT (SET/KITS/TRAYS/PACK) IMPLANT
KIT TURNOVER KIT A (KITS) IMPLANT
MARKER SKIN DUAL TIP RULER LAB (MISCELLANEOUS) ×4 IMPLANT
MAT PREVALON FULL STRYKER (MISCELLANEOUS) ×4 IMPLANT
NEEDLE SPNL 22GX3.5 QUINCKE BK (NEEDLE) ×4 IMPLANT
PACK CARDIOVASCULAR III (CUSTOM PROCEDURE TRAY) ×4 IMPLANT
PENCIL SMOKE EVACUATOR (MISCELLANEOUS) IMPLANT
RELOAD EGIA 45 MED/THCK PURPLE (STAPLE) ×4 IMPLANT
RELOAD EGIA 45 TAN VASC (STAPLE) ×4 IMPLANT
RELOAD EGIA 60 MED/THCK PURPLE (STAPLE) ×16 IMPLANT
RELOAD EGIA 60 TAN VASC (STAPLE) ×12 IMPLANT
RELOAD ENDO STITCH 2.0 (ENDOMECHANICALS) ×22
SCISSORS LAP 5X45 EPIX DISP (ENDOMECHANICALS) ×4 IMPLANT
SET IRRIG TUBING LAPAROSCOPIC (IRRIGATION / IRRIGATOR) ×4 IMPLANT
SET TUBE SMOKE EVAC HIGH FLOW (TUBING) ×4 IMPLANT
SHEARS HARMONIC ACE PLUS 45CM (MISCELLANEOUS) ×4 IMPLANT
SLEEVE XCEL OPT CAN 5 100 (ENDOMECHANICALS) ×12 IMPLANT
SOL ANTI FOG 6CC (MISCELLANEOUS) ×2 IMPLANT
SOLUTION ANTI FOG 6CC (MISCELLANEOUS) ×2
STAPLER VISISTAT 35W (STAPLE) IMPLANT
STRIP CLOSURE SKIN 1/2X4 (GAUZE/BANDAGES/DRESSINGS) ×3 IMPLANT
SUT ETHILON 2 0 PS N (SUTURE) IMPLANT
SUT MNCRL AB 4-0 PS2 18 (SUTURE) ×4 IMPLANT
SUT RELOAD ENDO STITCH 2 48X1 (ENDOMECHANICALS) ×10
SUT RELOAD ENDO STITCH 2.0 (ENDOMECHANICALS) ×12
SUT SILK 0 SH 30 (SUTURE) IMPLANT
SUT VICRYL 0 TIES 12 18 (SUTURE) IMPLANT
SUTURE RELOAD END STTCH 2 48X1 (ENDOMECHANICALS) ×10 IMPLANT
SUTURE RELOAD ENDO STITCH 2.0 (ENDOMECHANICALS) ×12 IMPLANT
SYR 20ML LL LF (SYRINGE) ×4 IMPLANT
SYR 50ML LL SCALE MARK (SYRINGE) ×4 IMPLANT
TOWEL OR 17X26 10 PK STRL BLUE (TOWEL DISPOSABLE) ×4 IMPLANT
TOWEL OR NON WOVEN STRL DISP B (DISPOSABLE) ×4 IMPLANT
TROCAR BLADELESS OPT 5 100 (ENDOMECHANICALS) ×4 IMPLANT
TROCAR XCEL 12X100 BLDLESS (ENDOMECHANICALS) ×4 IMPLANT
TUBING CONNECTING 10 (TUBING) ×3 IMPLANT
TUBING CONNECTING 10' (TUBING) ×1

## 2019-12-27 NOTE — Anesthesia Postprocedure Evaluation (Signed)
Anesthesia Post Note  Patient: Lisa Patel  Procedure(s) Performed: LAPAROSCOPIC ROUX-EN-Y GASTRIC BYPASS WITH UPPER ENDOSCOPY (N/A Abdomen) UPPER GI ENDOSCOPY (N/A )     Patient location during evaluation: PACU Anesthesia Type: General Level of consciousness: awake and alert Pain management: pain level controlled Vital Signs Assessment: post-procedure vital signs reviewed and stable Respiratory status: spontaneous breathing, nonlabored ventilation, respiratory function stable and patient connected to nasal cannula oxygen Cardiovascular status: blood pressure returned to baseline and stable Postop Assessment: no apparent nausea or vomiting Anesthetic complications: no   No complications documented.  Last Vitals:  Vitals:   12/27/19 1512 12/27/19 1615  BP: 129/75 (!) 153/84  Pulse: 79 66  Resp: 16 18  Temp: 36.9 C 36.9 C  SpO2: 99% 95%    Last Pain:  Vitals:   12/27/19 1615  TempSrc: Oral  PainSc:                  Rheda Kassab S

## 2019-12-27 NOTE — Op Note (Signed)
   Patient: Lisa Patel ([birth year], [id number])  Date of Surgery: 12/27/2019   Preoperative Diagnosis: morbid obesity (BMI 49.12)  Postoperative Diagnosis: morbid obesity (BMI 49.12)   Surgical Procedure: Upper Endoscopy   Surgeon:  Gurney Maxin, MD Assistant, and endoscopy: Louanna Raw, MD  Anesthesiologist: Myrtie Soman, MD CRNA: Lind Covert, CRNA; Gwyndolyn Saxon, CRNA   Anesthesia: General   Fluids:  Total I/O In: 1000 [I.V.:900; IV [id number]:505] Out: -   Complications: None  Drains:  None  Specimen: None   Indications for Procedure: [name] is a 49 y.o. female undergoing Roux-en-y gastric bypass with Dr. Kieth Brightly and an EGD was requested to evaluate foregut anatomy intraoperatively.  Description of Procedure: During the procedure, I scrubbed out and obtained the Olympus endoscope. I gently placed endoscope in the patient's oropharynx and gently glided it down the esophagus without any difficulty under direct visualization.  The scope was advanced as far as the [place] anastamosis and then slowly withdrawn to inspect the foregut anatomy.  Dr. Kieth Brightly had placed saline in the upper abdomen and all staple lines were submerged to ensure no air leak. There was no evidence of bubbles. There was no evidence of intraluminal bleeding and the mucosa appeared healthy.  The lumen was widely patent without evidence of stricture.  The GE junction was located at 39 cm.  The intraluminal insufflation was decompressed. The scope was withdrawn. The patient tolerated this portion of the procedure well. Please see Dr Amie Portland operative note for details regarding the remainder of the procedure.    Louanna Raw, MD General, Bariatric, & Minimally Invasive Surgery [facility], [place]

## 2019-12-27 NOTE — Anesthesia Procedure Notes (Signed)
Procedure Name: Intubation Date/Time: 12/27/2019 10:52 AM Performed by: Gwyndolyn Saxon, CRNA Pre-anesthesia Checklist: Patient identified, Emergency Drugs available, Suction available and Patient being monitored Patient Re-evaluated:Patient Re-evaluated prior to induction Oxygen Delivery Method: Circle system utilized Preoxygenation: Pre-oxygenation with 100% oxygen Induction Type: IV induction Ventilation: Mask ventilation without difficulty and Oral airway inserted - appropriate to patient size Laryngoscope Size: Sabra Heck and 2 Grade View: Grade I Tube type: Oral Tube size: 7.0 mm Number of attempts: 1 Airway Equipment and Method: Patient positioned with wedge pillow and Stylet Placement Confirmation: ETT inserted through vocal cords under direct vision,  positive ETCO2 and breath sounds checked- equal and bilateral Secured at: 21 cm Tube secured with: Tape Dental Injury: Teeth and Oropharynx as per pre-operative assessment

## 2019-12-27 NOTE — Progress Notes (Signed)
PHARMACY CONSULT FOR:  Risk Assessment for Post-Discharge VTE Following Bariatric Surgery  Post-Discharge VTE Risk Assessment: This patient's probability of 30-day post-discharge VTE is increased due to the factors marked:   Female    Age >/=60 years    BMI >/=50 kg/m2    CHF    Dyspnea at Rest    Paraplegia   X Non-gastric-band surgery    Operation Time >/=3 hr    Return to OR     Length of Stay >/= 3 d   Hx of VTE   Hypercoagulable condition   Significant venous stasis    Predicted probability of 30-day post-discharge VTE: 0.16%, mild  Recommendation for Discharge: No pharmacologic prophylaxis post-discharge      Lisa Patel is a 49 y.o. female who underwent  LAPAROSCOPIC ROUX-EN-Y GASTRIC BYPASS WITH UPPER ENDOSCOPY on 12/27/2019.   Case start: 1113 Case end: 1241   No Known Allergies  Patient Measurements: Height: 5\' 5"  (165.1 cm) Weight: 133.9 kg (295 lb 3.2 oz) IBW/kg (Calculated) : 57 Body mass index is 49.12 kg/m.  Recent Labs    12/27/19 0933 12/27/19 1323  WBC 7.4  --   HGB 13.1 13.0  HCT 39.7 39.7  PLT 186  --   CREATININE 0.78  --   ALBUMIN 4.0  --   PROT 7.3  --   AST 25  --   ALT 19  --   ALKPHOS 52  --   BILITOT 0.7  --    Estimated Creatinine Clearance: 115.3 mL/min (by C-G formula based on SCr of 0.78 mg/dL).    Past Medical History:  Diagnosis Date  . Diabetes mellitus   . GERD (gastroesophageal reflux disease)   . Hyperlipidemia   . Hypertension   . Hypothyroidism   . Normal vaginal delivery    02-1993 and 2000 had epidural anesthesia  . Obesity   . Panic attack   . Seasonal allergies    seasonal  . Vitamin D deficiency      Medications Prior to Admission  Medication Sig Dispense Refill Last Dose  . acetaminophen (TYLENOL) 500 MG tablet Take 2,000 mg by mouth daily.   12/26/2019 at Unknown time  . citalopram (CELEXA) 20 MG tablet TAKE 1 TABLET(20 MG) BY MOUTH DAILY (Patient taking differently: Take 20 mg by mouth  daily.) 90 tablet 3 12/26/2019 at Unknown time  . famotidine (PEPCID) 20 MG tablet Take 20 mg by mouth at bedtime as needed for heartburn or indigestion.   Past Week at Unknown time  . Inulin (FIBER CHOICE PREBIOTIC FIBER) 1.5 g CHEW Chew 3 tablets by mouth daily.   Past Week at Unknown time  . levothyroxine (SYNTHROID) 100 MCG tablet TAKE 1 TABLET(100 MCG) BY MOUTH DAILY (Patient taking differently: Take 100 mcg by mouth daily.) 90 tablet 3 12/26/2019 at Unknown time  . lisinopril (ZESTRIL) 5 MG tablet Take one by mouth daily (Patient taking differently: Take 5 mg by mouth daily.) 90 tablet 3 12/26/2019 at Unknown time  . metFORMIN (GLUCOPHAGE) 500 MG tablet TAKE 1 TABLET(500 MG) BY MOUTH DAILY (Patient taking differently: Take 500 mg by mouth daily.) 90 tablet 3 12/26/2019 at 0900  . montelukast (SINGULAIR) 10 MG tablet TAKE 1 TABLET(10 MG) BY MOUTH AT BEDTIME (Patient taking differently: Take 10 mg by mouth at bedtime.) 90 tablet 3 12/26/2019 at Unknown time  . Multiple Vitamin (MULTIVITAMIN) tablet Take 1 tablet by mouth daily.   12/26/2019 at Unknown time  . omeprazole (PRILOSEC) 20 MG capsule  Take 20 mg by mouth daily.   12/27/2019 at [place]  . prednisoLONE acetate (PRED FORTE) 1 % ophthalmic suspension Place 1 drop into the left eye daily as needed (inflame eyes).   Past Week at Unknown time  . simvastatin (ZOCOR) 20 MG tablet TAKE 1 TABLET(20 MG) BY MOUTH AT BEDTIME (Patient taking differently: Take 20 mg by mouth at bedtime.) 90 tablet 3 12/26/2019 at Unknown time  . VITAMIN D PO Take 1 capsule by mouth daily.   12/26/2019 at Unknown time  . diphenhydramine-acetaminophen (TYLENOL PM) 25-500 MG TABS tablet Take 2 tablets by mouth at bedtime.   12/25/2019  . loratadine-pseudoephedrine (CLARITIN-D 24-HOUR) 10-240 MG 24 hr tablet Take 1 tablet by mouth daily as needed for allergies.   More than a month at Unknown time    Gretta Arab PharmD, BCPS Clinical Pharmacist [facility] main pharmacy  [phone] 12/27/2019 2:17 PM

## 2019-12-27 NOTE — Anesthesia Preprocedure Evaluation (Signed)
Anesthesia Evaluation  Patient identified by MRN, date of birth, ID band Patient awake    Reviewed: Allergy & Precautions, H&P , NPO status , Patient's Chart, lab work & pertinent test results  Airway Mallampati: III  TM Distance: <3 FB Neck ROM: Full    Dental no notable dental hx.    Pulmonary neg pulmonary ROS,    Pulmonary exam normal breath sounds clear to auscultation       Cardiovascular hypertension, Pt. on medications Normal cardiovascular exam Rhythm:Regular Rate:Normal     Neuro/Psych Anxiety negative neurological ROS  negative psych ROS   GI/Hepatic Neg liver ROS, GERD  Medicated,  Endo/Other  diabetesHypothyroidism Morbid obesity  Renal/GU negative Renal ROS  negative genitourinary   Musculoskeletal negative musculoskeletal ROS (+)   Abdominal   Peds negative pediatric ROS (+)  Hematology negative hematology ROS (+)   Anesthesia Other Findings   Reproductive/Obstetrics negative OB ROS                             Anesthesia Physical Anesthesia Plan  ASA: III  Anesthesia Plan: General   Post-op Pain Management:    Induction: Intravenous  PONV Risk Score and Plan: 3 and Ondansetron, Dexamethasone, Treatment may vary due to age or medical condition, Midazolam and Scopolamine patch - Pre-op  Airway Management Planned: Oral ETT  Additional Equipment:   Intra-op Plan:   Post-operative Plan: Extubation in OR  Informed Consent: I have reviewed the patients History and Physical, chart, labs and discussed the procedure including the risks, benefits and alternatives for the proposed anesthesia with the patient or authorized representative who has indicated his/her understanding and acceptance.     Dental advisory given  Plan Discussed with: CRNA and Surgeon  Anesthesia Plan Comments:         Anesthesia Quick Evaluation

## 2019-12-27 NOTE — Op Note (Signed)
Preop Diagnosis: Obesity Class III  Postop Diagnosis: same  Procedure performed: laparoscopic Roux en Y gastric bypass  Assitant: Louanna Raw, M.D.  Indications:  The patient is a 49 y.o. year-old morbidly obese female who has been followed in the Bariatric Clinic as an outpatient. This patient was diagnosed with morbid obesity with a BMI of Body mass index is 49.12 kg/m. and significant co-morbidities including hypertension and non-insulin dependent diabetes.  The patient was counseled extensively in the Bariatric Outpatient Clinic and after a thorough explanation of the risks and benefits of surgery (including death from complications, bowel leak, infection such as peritonitis and/or sepsis, internal hernia, bleeding, need for blood transfusion, bowel obstruction, organ failure, pulmonary embolus, deep venous thrombosis, wound infection, incisional hernia, skin breakdown, and others entailed on the consent form) and after a compliant diet and exercise program, the patient was scheduled for an elective laparoscopic gastric bypass.  Description of Operation:  Following informed consent, the patient was taken to the operating room and placed on the operating table in the supine position.  She had previously received prophylactic antibiotics and subcutaneous heparin for DVT prophylaxis in the pre-op holding area.  After induction of general endotracheal anesthesia by the anesthesiologist, the patient underwent placement of sequential compression devices, Foley catheter and an oro-gastric tube.  A timeout was confirmed by the surgery and anesthesia teams.  The patient was adequately padded at all pressure points and placed on a footboard to prevent slippage from the OR table during extremes of position during surgery.  She underwent a routine sterile prep and drape of her entire abdomen.    Next, A transverse incision was made under the left subcostal area and a 53mm optical viewing trocar was  introduced into the peritoneal cavity. Pneumoperitoneum was applied with a high flow and low pressure. A laparoscope was inserted to confirm placement. A extraperitoneal block was then placed at the lateral abdominal wall using exparel diluted with marcaine . 5 additional trocars were placed: 1 56mm trocar to the left of the midline. 1 additional 52mm trocar in the left lateral area, 1 57mm trocar in the right mid abdomen, and 1 54mm trocar in the right subcostal area.  The greater omentum was flipped over the transverse colon and under the left lobe of the liver. The ligament of trietz was identified. 40cm of jejunum was measured starting from the ligament of Trietz. The mesentery was checked to ensure mobility. Next, a 105mm 2-74mm tristapler was used to divide the jejunum at this location. The harmonic scalpel was used to divide the mesentery down to the origin. A 1/2" penrose was sutured to the distal side. 100cm of jejunum was measured starting at the division. 2-0 silk was used to appose the biliary limb to the 100cm mark of jejunum in 2 places. Enterotomies were made in the biliary and common channels and a 61mm 2-3 tristapler was used to create the J-J anastomosis. A 2-0 silk was used to appose the enterotomy edges and a 55mm 2-3 tristapler was used to close the enterotomy. An anti-obstruction 2-0 silk suture was placed. Next, the mesenteric defect was closed with a 2-0 silk in running fashion.The J-J appeared patent and in neutral position.  Next, the omentum was divided using the Harmonic scalpel. The patient was placed in steep Reverse Trendelenberg position. A Nathanson retracted was placed through a subxiphoid incision and used to retract the liver. The fat pad over the fundus was incised to free the fundus. Next, a position along  the lesser curve 6cm from GE junction was identified. The pars flaccida was entered and the fat over the lesser curve divided to enter the lesser sac. Multiple 88mm 3-44mm  tristaple firings were peformed to create a 6cm pouch. The Roux limb was identified using the placed penrose and brought up to the stomach in antecolic fashion. The limb was inspected to ensure a neutral position. A 2-0 vicryl suture was then used to create a posterior layer connecting the stomach to the Roux limb jejunum in running fashion. Next cautery was used to create an enterotomy along the medial aspect of this suture line and Harmonic scalpel used to create gastotomy. A 84mm 3-99mm tristapler was then used to create a 25-5mm anastomosis. 2 2-0 vicryl sutures were used in running fashion to close the gastrotomy. Finally, a 2-0 vicryl suture was used to close an anterior layer of stomach and jejunum over the anastomosis in running fashion. The penrose was removed from the Roux limb. A 2-0 silk was used to appose the transverse mesocolon to the mesentery of the Roux limb.   The assistant then went and performed an upper endoscopy and leak test. No bubbles were seen and the pouch and limb distended appropriately. The limb and pouch were deflated, the endoscope was removed. Hemostasis was ensured. Pneumoperitoneum was evacuated, all ports were removed and all incisions closed with 4-0 monocryl suture in subcuticular fashion. Steristrips and bandaids were put in place for dressing. The patient awoke from anesthesia and was brought to pacu in stable condition. All counts were correct.  Specimens:  None  Estimated Blood Loss: <20 ml  Local Anesthesia: 50 ml Exparel: 0.5% Marcaine Mix  Post-Op Plan:       Pain Management: PO, prn      Antibiotics: Prophylactic      Anticoagulation: Prophylactic, Starting now      Post Op Studies/Consults: Not applicable      Intended Discharge: within 48h      Intended Outpatient Follow-Up: Two Week      Intended Outpatient Studies: Not Applicable      Other: Not Applicable   Arta Bruce Ishita Mcnerney

## 2019-12-27 NOTE — Transfer of Care (Signed)
Immediate Anesthesia Transfer of Care Note  Patient: Lisa Patel  Procedure(s) Performed: LAPAROSCOPIC ROUX-EN-Y GASTRIC BYPASS WITH UPPER ENDOSCOPY (N/A Abdomen) UPPER GI ENDOSCOPY (N/A )  Patient Location: PACU  Anesthesia Type:General  Level of Consciousness: drowsy and patient cooperative  Airway & Oxygen Therapy: Patient Spontanous Breathing and Patient connected to face mask oxygen  Post-op Assessment: Report given to RN and Post -op Vital signs reviewed and stable  Post vital signs: Reviewed and stable  Last Vitals:  Vitals Value Taken Time  BP 131/73 12/27/19 1249  Temp    Pulse 80 12/27/19 1252  Resp 18 12/27/19 1252  SpO2 100 % 12/27/19 1252  Vitals shown include unvalidated device data.  Last Pain:  Vitals:   12/27/19 0918  TempSrc:   PainSc: 0-No pain      Patients Stated Pain Goal: 3 (66/06/30 1601)  Complications: No complications documented.

## 2019-12-27 NOTE — Discharge Instructions (Signed)
GASTRIC BYPASS/SLEEVE  Home Care Instructions   These instructions are to help you care for yourself when you go home.  Call: If you have any problems. . Call [phone] and ask for the surgeon on call . If you need immediate help, come to the ER at [facility].  . Tell the ER staff that you are a new post-op gastric bypass or gastric sleeve patient   Signs and symptoms to report: . Severe vomiting or nausea o If you cannot keep down clear liquids for longer than 1 day, call your surgeon  . Abdominal pain that does not get better after taking your pain medication . Fever over 100.4 F with chills . Heart beating over 100 beats a minute . Shortness of breath at rest . Chest pain .  Redness, swelling, drainage, or foul odor at incision (surgical) sites .  If your incisions open or pull apart . Swelling or pain in calf (lower leg) . Diarrhea (Loose bowel movements that happen often), frequent watery, uncontrolled bowel movements . Constipation, (no bowel movements for 3 days) if this happens: Pick one o Milk of Magnesia, 2 tablespoons by mouth, 3 times a day for 2 days if needed o Stop taking Milk of Magnesia once you have a bowel movement o Call your doctor if constipation continues Or o Miralax  (instead of Milk of Magnesia) following the label instructions o Stop taking Miralax once you have a bowel movement o Call your doctor if constipation continues . Anything you think is not normal   Normal side effects after surgery: . Unable to sleep at night or unable to focus . Irritability or moody . Being tearful (crying) or depressed These are common complaints, possibly related to your anesthesia medications that put you to sleep, stress of surgery, and change in lifestyle.  This usually goes away a few weeks after surgery.  If these feelings continue, call your primary care doctor.   Wound Care: You may have surgical glue, steri-strips, or staples over your incisions after  surgery . Surgical glue:  Looks like a clear film over your incisions and will wear off a little at a time . Steri-strips: Strips of tape over your incisions. You may notice a yellowish color on the skin under the steri-strips. This is used to make the   steri-strips stick better. Do not pull the steri-strips off - let them fall off . Staples: Jodell Cipro may be removed before you leave the hospital o If you go home with staples, call [facility], [phone]) [phone] at for an appointment with your surgeon's nurse to have staples removed 10 days after surgery. . Showering: You may shower two (2) days after your surgery unless your surgeon tells you differently o Wash gently around incisions with warm soapy water, rinse well, and gently pat dry  o No tub baths until staples are removed, steri-strips fall off or glue is gone.    Medications: Marland Kitchen Medications should be liquid or crushed if larger than the size of a dime . Extended release pills (medication that release a little bit at a time through the day) should NOT be crushed or cut. (examples include XL, ER, DR, SR) . Depending on the size and number of medications you take, you may need to space (take a few throughout the day)/change the time you take your medications so that you do not over-fill your pouch (smaller stomach) . Make sure you follow-up with your primary care doctor to  make medication changes needed during rapid weight loss and life-style changes . If you have diabetes, follow up with the doctor that orders your diabetes medication(s) within one week after surgery and check your blood sugar regularly. . Do not drive while taking prescription pain medication  . It is ok to take Tylenol by the bottle instructions with your pain medicine or instead of your pain medicine as needed.  DO NOT TAKE NSAIDS (EXAMPLES OF NSAIDS:  IBUPROFREN/ NAPROXEN)  Diet:                    First 2 Weeks  You will see the dietician t about two (2) weeks  after your surgery. The dietician will increase the types of foods you can eat if you are handling liquids well: Marland Kitchen If you have severe vomiting or nausea and cannot keep down clear liquids lasting longer than 1 day, call your surgeon @ [phone]) Protein Shake . Drink at least 2 ounces of shake 5-6 times per day . Each serving of protein shakes (usually 8 - 12 ounces) should have: o 15 grams of protein  o And no more than 5 grams of carbohydrate  . Goal for protein each day: o Men = 80 grams per day o Women = 60 grams per day . Protein powder may be added to fluids such as non-fat milk or Lactaid milk or unsweetened Soy/Almond milk (limit to 35 grams added protein powder per serving)  Hydration . Slowly increase the amount of water and other clear liquids as tolerated (See Acceptable Fluids) . Slowly increase the amount of protein shake as tolerated  .  Sip fluids slowly and throughout the day.  Do not use straws. . May use sugar substitutes in small amounts (no more than 6 - 8 packets per day; i.e. Splenda)  Fluid Goal . The first goal is to drink at least 8 ounces of protein shake/drink per day (or as directed by the nutritionist); some examples of protein shakes are [employer], [employer], EAS Edge HP, and Unjury. See handout from pre-op Bariatric Education Class: o Slowly increase the amount of protein shake you drink as tolerated o You may find it easier to slowly sip shakes throughout the day o It is important to get your proteins in first . Your fluid goal is to drink 64 - 100 ounces of fluid daily o It may take a few weeks to build up to this . 32 oz (or more) should be clear liquids  And  . 32 oz (or more) should be full liquids (see below for examples) . Liquids should not contain sugar, caffeine, or carbonation  Clear Liquids: . Water or Sugar-free flavored water (i.e. Fruit H2O, Propel) . Decaffeinated coffee or tea (sugar-free) . [employer], [employer],  Minute [facility] . Sugar-free Jell-O . Bouillon or broth . Sugar-free Popsicle:   *Less than 20 calories each; Limit 1 per day  Full Liquids: Protein Shakes/Drinks + 2 choices per day of other full liquids . Full liquids must be: o No More Than 15 grams of Carbs per serving  o No More Than 3 grams of Fat per serving . Strained low-fat cream soup (except Cream of Potato or Tomato) . Non-Fat milk . Fat-free Lactaid Milk . Unsweetened Soy Or Unsweetened Almond Milk . Low Sugar yogurt (Dannon Lite & Fit, [place] yogurt; Oikos Triple Zero; Chobani Simply 100; Yoplait 100 calorie [place] - No Fruit on the Bottom)    Vitamins  and Minerals . Start 1 day after surgery unless otherwise directed by your surgeon . Chewable Bariatric Specific Multivitamin / Multimineral Supplement with iron (Example: Bariatric Advantage Multi EA) . Chewable Calcium with Vitamin D-3 (Example: 3 Chewable Calcium Plus 600 with Vitamin D-3) o Take 500 mg three (3) times a day for a total of 1500 mg each day o Do not take all 3 doses of calcium at one time as it may cause constipation, and you can only absorb 500 mg  at a time  o Do not mix multivitamins containing iron with calcium supplements; take 2 hours apart . Menstruating women and those with a history of anemia (a blood disease that causes weakness) may need extra iron o Talk with your doctor to see if you need more iron . Do not stop taking or change any vitamins or minerals until you talk to your dietitian or surgeon . Your Dietitian and/or surgeon must approve all vitamin and mineral supplements   Activity and Exercise: Limit your physical activity as instructed by your doctor.  It is important to continue walking at home.  During this time, use these guidelines: . Do not lift anything greater than ten (10) pounds for at least two (2) weeks . Do not go back to work or drive until Engineer, production says you can . You may have sex when you feel comfortable  o It is  VERY important for female patients to use a reliable birth control method; fertility often increases after surgery  o All hormonal birth control will be ineffective for 30 days after surgery due to medications given during surgery a barrier method must be used. o Do not get pregnant for at least 18 months . Start exercising as soon as your doctor tells you that you can o Make sure your doctor approves any physical activity . Start with a simple walking program . Walk 5-15 minutes each day, 7 days per week.  . Slowly increase until you are walking 30-45 minutes per day Consider joining our [place] program. [phone] or email [email or web address]   Special Instructions Things to remember: . Use your CPAP when sleeping if this applies to you  . [facility] has two free Bariatric Surgery Support Groups that meet monthly o The 3rd Thursday of each month, 6 pm, [employer] . It is very important to keep all follow up appointments with your surgeon, dietitian, primary care physician, and behavioral health practitioner . Routine follow up schedule with your surgeon include appointments at 2-3 weeks, 6-8 weeks, 6 months, and 1 year at a minimum.  Your surgeon may request to see you more often.   . After the first year, please follow up with your bariatric surgeon and dietitian at least once a year in order to maintain best weight loss results  [facility]: [place]: [phone] Bariatric Nurse Coordinator: [phone]      Reviewed and Endorsed  by [facility] Patient Education Committee, June, 2016 Edits Approved: Aug, 2018

## 2019-12-27 NOTE — Progress Notes (Signed)
Discussed post op day goals with patient including ambulation, IS, diet progression, pain, and nausea control.  BSTOP education provided including BSTOP information guide, "Guide for Pain Management after your Bariatric Procedure".  Questions answered. 

## 2019-12-27 NOTE — H&P (Signed)
Lisa Patel is an 49 y.o. female.   Chief Complaint: obesity HPI: 49 yo female with obesity with multiple comorbidities. She has completed all requirements and presents for bariatric surgery.  Past Medical History:  Diagnosis Date  . Diabetes mellitus   . GERD (gastroesophageal reflux disease)   . Hyperlipidemia   . Hypertension   . Hypothyroidism   . Normal vaginal delivery    02-1993 and 2000 had epidural anesthesia  . Obesity   . Panic attack   . Seasonal allergies    seasonal  . Vitamin D deficiency     Past Surgical History:  Procedure Laterality Date  . COLONOSCOPY WITH PROPOFOL N/A 01/18/2019   Procedure: COLONOSCOPY WITH PROPOFOL;  Surgeon: [name], DO;  Location: [facility];  Service: Gastroenterology;  Laterality: N/A;    Family History  Problem Relation Age of Onset  . Diabetes Maternal Grandmother   . Clotting disorder Maternal Grandmother   . Diabetes Maternal Grandfather   . Diabetes Paternal Grandmother   . Diabetes Paternal Grandfather   . Colon cancer Neg Hx   . Esophageal cancer Neg Hx   . Colon polyps Neg Hx   . Rectal cancer Neg Hx   . Stomach cancer Neg Hx    Social History:  reports that she has never smoked. She has never used smokeless tobacco. She reports previous alcohol use. She reports that she does not use drugs.  Allergies: No Known Allergies  Medications Prior to Admission  Medication Sig Dispense Refill  . acetaminophen (TYLENOL) 500 MG tablet Take 2,000 mg by mouth daily.    . citalopram (CELEXA) 20 MG tablet TAKE 1 TABLET(20 MG) BY MOUTH DAILY (Patient taking differently: Take 20 mg by mouth daily.) 90 tablet 3  . famotidine (PEPCID) 20 MG tablet Take 20 mg by mouth at bedtime as needed for heartburn or indigestion.    . Inulin (FIBER CHOICE PREBIOTIC FIBER) 1.5 g CHEW Chew 3 tablets by mouth daily.    Marland Kitchen levothyroxine (SYNTHROID) 100 MCG tablet TAKE 1 TABLET(100 MCG) BY MOUTH DAILY (Patient taking differently: Take 100  mcg by mouth daily.) 90 tablet 3  . lisinopril (ZESTRIL) 5 MG tablet Take one by mouth daily (Patient taking differently: Take 5 mg by mouth daily.) 90 tablet 3  . metFORMIN (GLUCOPHAGE) 500 MG tablet TAKE 1 TABLET(500 MG) BY MOUTH DAILY (Patient taking differently: Take 500 mg by mouth daily.) 90 tablet 3  . montelukast (SINGULAIR) 10 MG tablet TAKE 1 TABLET(10 MG) BY MOUTH AT BEDTIME (Patient taking differently: Take 10 mg by mouth at bedtime.) 90 tablet 3  . Multiple Vitamin (MULTIVITAMIN) tablet Take 1 tablet by mouth daily.    Marland Kitchen omeprazole (PRILOSEC) 20 MG capsule Take 20 mg by mouth daily.    . prednisoLONE acetate (PRED FORTE) 1 % ophthalmic suspension Place 1 drop into the left eye daily as needed (inflame eyes).    . simvastatin (ZOCOR) 20 MG tablet TAKE 1 TABLET(20 MG) BY MOUTH AT BEDTIME (Patient taking differently: Take 20 mg by mouth at bedtime.) 90 tablet 3  . VITAMIN D PO Take 1 capsule by mouth daily.    . diphenhydramine-acetaminophen (TYLENOL PM) 25-500 MG TABS tablet Take 2 tablets by mouth at bedtime.    Marland Kitchen loratadine-pseudoephedrine (CLARITIN-D 24-HOUR) 10-240 MG 24 hr tablet Take 1 tablet by mouth daily as needed for allergies.      Results for orders placed or performed during the hospital encounter of 12/27/19 (from the past 48 hour(s))  Pregnancy, urine STAT morning of surgery     Status: None   Collection Time: 12/27/19  8:52 AM  Result Value Ref Range   Preg Test, Ur NEGATIVE NEGATIVE    Comment:        THE SENSITIVITY OF THIS METHODOLOGY IS >20 mIU/mL. Performed at [facility], [place] [address] [place], [place] [address]   Glucose, capillary     Status: Abnormal   Collection Time: 12/27/19  9:12 AM  Result Value Ref Range   Glucose-Capillary 129 (H) 70 - 99 mg/dL    Comment: Glucose reference range applies only to samples taken after fasting for at least 8 hours.   Comment 1 Notify RN   Comprehensive metabolic panel     Status: Abnormal    Collection Time: 12/27/19  9:33 AM  Result Value Ref Range   Sodium 135 135 - 145 mmol/L   Potassium 4.4 3.5 - 5.1 mmol/L   Chloride 100 98 - 111 mmol/L   CO2 24 22 - 32 mmol/L   Glucose, Bld 131 (H) 70 - 99 mg/dL    Comment: Glucose reference range applies only to samples taken after fasting for at least 8 hours.   BUN 23 (H) 6 - 20 mg/dL   Creatinine, Ser 0.78 0.44 - 1.00 mg/dL   Calcium 9.3 8.9 - 10.3 mg/dL   Total Protein 7.3 6.5 - 8.1 g/dL   Albumin 4.0 3.5 - 5.0 g/dL   AST 25 15 - 41 U/L   ALT 19 0 - 44 U/L   Alkaline Phosphatase 52 38 - 126 U/L   Total Bilirubin 0.7 0.3 - 1.2 mg/dL   GFR, Estimated >60 >60 mL/min    Comment: (NOTE) Calculated using the CKD-EPI Creatinine Equation (2021)    Anion gap 11 5 - 15    Comment: Performed at [facility], [place] [address], [place], [place] [address]   No results found.  Review of Systems  Constitutional: Negative for chills and fever.  HENT: Negative for hearing loss.   Respiratory: Negative for cough.   Cardiovascular: Negative for chest pain and palpitations.  Gastrointestinal: Negative for abdominal pain, nausea and vomiting.  Genitourinary: Negative for dysuria and urgency.  Musculoskeletal: Negative for myalgias and neck pain.  Skin: Negative for rash.  Neurological: Negative for dizziness and headaches.  Hematological: Does not bruise/bleed easily.  Psychiatric/Behavioral: Negative for suicidal ideas.    Blood pressure 138/84, pulse 75, temperature 97.9 F (36.6 C), temperature source Oral, resp. rate 18, height 5\' 5"  (1.651 m), weight 133.9 kg, last menstrual period 12/06/2019, SpO2 100 %. Physical Exam Vitals reviewed.  Constitutional:      Appearance: She is well-developed and well-nourished.  HENT:     Head: Normocephalic and atraumatic.  Eyes:     Extraocular Movements: EOM normal.     Conjunctiva/sclera: Conjunctivae normal.     Pupils: Pupils are equal, round, and reactive to light.   Cardiovascular:     Rate and Rhythm: Normal rate and regular rhythm.  Pulmonary:     Effort: Pulmonary effort is normal.     Breath sounds: Normal breath sounds.  Abdominal:     General: Bowel sounds are normal. There is no distension.     Palpations: Abdomen is soft.     Tenderness: There is no abdominal tenderness.  Musculoskeletal:        General: Normal range of motion.     Cervical back: Normal range of motion and neck supple.  Skin:    General: Skin is warm and dry.  Neurological:     Mental Status: She is alert and oriented to person, place, and time.  Psychiatric:        Mood and Affect: Mood and affect normal.        Behavior: Behavior normal.      Assessment/Plan 49 yo female with morbid obesity, diabetes mellitus and multiple related comorbidities -lap RNY gastric bypass -ERAS protocol -inpatient admission  Mickeal Skinner, MD 12/27/2019, 10:11 AM

## 2019-12-28 ENCOUNTER — Encounter (HOSPITAL_COMMUNITY): Payer: Self-pay | Admitting: General Surgery

## 2019-12-28 LAB — COMPREHENSIVE METABOLIC PANEL
ALT: 19 U/L (ref 0–44)
AST: 25 U/L (ref 15–41)
Albumin: 4 g/dL (ref 3.5–5.0)
Alkaline Phosphatase: 46 U/L (ref 38–126)
Anion gap: 9 (ref 5–15)
BUN: 12 mg/dL (ref 6–20)
CO2: 25 mmol/L (ref 22–32)
Calcium: 8.9 mg/dL (ref 8.9–10.3)
Chloride: 104 mmol/L (ref 98–111)
Creatinine, Ser: 0.79 mg/dL (ref 0.44–1.00)
GFR, Estimated: >60 mL/min (ref >60)
Glucose, Bld: 160 mg/dL — ABNORMAL HIGH (ref 70–99)
Potassium: 4.1 mmol/L (ref 3.5–5.1)
Sodium: 138 mmol/L (ref 135–145)
Total Bilirubin: 0.6 mg/dL (ref 0.3–1.2)
Total Protein: 6.9 g/dL (ref 6.5–8.1)

## 2019-12-28 LAB — CBC WITH DIFFERENTIAL/PLATELET
Abs Immature Granulocytes: 0.03 10*3/uL (ref 0.00–0.07)
Basophils Absolute: 0 10*3/uL (ref 0.0–0.1)
Basophils Relative: 0 %
Eosinophils Absolute: 0 10*3/uL (ref 0.0–0.5)
Eosinophils Relative: 0 %
HCT: 36.8 % (ref 36.0–46.0)
Hemoglobin: 12.3 g/dL (ref 12.0–15.0)
Immature Granulocytes: 0 %
Lymphocytes Relative: 9 %
Lymphs Abs: 0.8 10*3/uL (ref 0.7–4.0)
MCH: 29.6 pg (ref 26.0–34.0)
MCHC: 33.4 g/dL (ref 30.0–36.0)
MCV: 88.7 fL (ref 80.0–100.0)
Monocytes Absolute: 0.7 10*3/uL (ref 0.1–1.0)
Monocytes Relative: 7 %
Neutro Abs: 7.5 10*3/uL (ref 1.7–7.7)
Neutrophils Relative %: 84 %
Platelets: 230 10*3/uL (ref 150–400)
RBC: 4.15 MIL/uL (ref 3.87–5.11)
RDW: 13.1 % (ref 11.5–15.5)
WBC: 9 10*3/uL (ref 4.0–10.5)
nRBC: 0 % (ref 0.0–0.2)

## 2019-12-28 LAB — GLUCOSE, CAPILLARY
Glucose-Capillary: 127 mg/dL — ABNORMAL HIGH (ref 70–99)
Glucose-Capillary: 130 mg/dL — ABNORMAL HIGH (ref 70–99)
Glucose-Capillary: 145 mg/dL — ABNORMAL HIGH (ref 70–99)
Glucose-Capillary: 154 mg/dL — ABNORMAL HIGH (ref 70–99)
Glucose-Capillary: 115 mg/dL — ABNORMAL HIGH (ref 70–99)

## 2019-12-28 NOTE — Progress Notes (Signed)
   Progress Note: Metabolic and Bariatric Surgery Service   Chief Complaint/Subjective: Nauseated this morning  Objective: Vital signs in last 24 hours: Temp:  [97.8 F (36.6 C)-98.5 F (36.9 C)] 97.9 F (36.6 C) (12/14 0947) Pulse Rate:  [58-83] 60 (12/14 0947) Resp:  [10-18] 18 (12/14 0947) BP: (128-153)/(59-84) 138/69 (12/14 0947) SpO2:  [95 %-100 %] 98 % (12/14 0947) Last BM Date: 12/26/19  Intake/Output from previous day: 12/13 0701 - 12/14 0700 In: 3295.9 [P.O.:180; I.V.:2715.9; IV Piggyback:400] Out: 3525 [Urine:3500; Blood:25] Intake/Output this shift: Total I/O In: -  Out: 400 [Urine:400]  Lungs: nonlabored  Cardiovascular: RRR  Abd: soft, incisions c/d/i  Extremities: no edema  Neuro: AOx4  Lab Results: CBC  Recent Labs    12/27/19 0933 12/27/19 1323 12/28/19 0439  WBC 7.4  --  9.0  HGB 13.1 13.0 12.3  HCT 39.7 39.7 36.8  PLT 186  --  230   BMET Recent Labs    12/27/19 0933 12/28/19 0439  NA 135 138  K 4.4 4.1  CL 100 104  CO2 24 25  GLUCOSE 131* 160*  BUN 23* 12  CREATININE 0.78 0.79  CALCIUM 9.3 8.9   PT/INR No results for input(s): LABPROT, INR in the last 72 hours. ABG No results for input(s): PHART, HCO3 in the last 72 hours.  Invalid input(s): PCO2, PO2  Studies/Results:  Anti-infectives: Anti-infectives (From admission, onward)   Start     Dose/Rate Route Frequency Ordered Stop   12/27/19 0900  cefoTEtan (CEFOTAN) 2 g in sodium chloride 0.9 % 100 mL IVPB        2 g 200 mL/hr over 30 Minutes Intravenous On call to O.R. 12/27/19 0851 12/27/19 1123      Medications: Scheduled Meds: . acetaminophen  1,000 mg Oral Q8H   Or  . acetaminophen (TYLENOL) oral liquid 160 mg/5 mL  1,000 mg Oral Q8H  . citalopram  20 mg Oral Daily  . enoxaparin (LOVENOX) injection  30 mg Subcutaneous Q12H  . insulin aspart  0-20 Units Subcutaneous Q4H  . levothyroxine  100 mcg Oral Daily  . Ensure Max Protein  2 oz Oral Q2H  . scopolamine   1 patch Transdermal On Call to OR   Continuous Infusions: . dextrose 5 % and 0.45% NaCl 125 mL/hr at 12/28/19 0919  . famotidine (PEPCID) IV 20 mg (12/28/19 0921)   PRN Meds:.hydrALAZINE, morphine injection, ondansetron (ZOFRAN) IV, oxyCODONE, simethicone  Assessment/Plan: Patient Active Problem List   Diagnosis Date Noted  . Morbid obesity ([place]) 12/27/2019  . Positive colorectal cancer screening using Cologuard test   . Diverticulosis of colon without hemorrhage   . Grade II hemorrhoids   . Numbness of foot 09/11/2011  . Hypothyroid 09/11/2011  . Diet-controlled diabetes mellitus ([place]) 10/02/2009  . Hyperlipidemia associated with type 2 diabetes mellitus ([place]) 10/02/2009  . ANXIETY STATE, UNSPECIFIED 09/04/2009  . PARESTHESIA 09/04/2009  . SNORING 09/04/2009  . VITAMIN D DEFICIENCY 08/31/2008  . HYPERKALEMIA 08/31/2008  . GERD 08/31/2008   s/p Procedure(s): LAPAROSCOPIC ROUX-EN-Y GASTRIC BYPASS WITH UPPER ENDOSCOPY UPPER GI ENDOSCOPY 12/27/2019 -continue SSI -ambulate -encourage PO slowly  Disposition:  LOS: 1 day  The patient does not meet criteria for discharge because She has persistant nausea and vomiting and is at high risk of dehydration   Mickeal Skinner, MD [phone] [facility]

## 2019-12-29 ENCOUNTER — Other Ambulatory Visit (HOSPITAL_COMMUNITY): Payer: Self-pay | Admitting: General Surgery

## 2019-12-29 LAB — CBC WITH DIFFERENTIAL/PLATELET
Abs Immature Granulocytes: 0.03 10*3/uL (ref 0.00–0.07)
Basophils Absolute: 0 10*3/uL (ref 0.0–0.1)
Basophils Relative: 0 %
Eosinophils Absolute: 0.1 10*3/uL (ref 0.0–0.5)
Eosinophils Relative: 1 %
HCT: 37 % (ref 36.0–46.0)
Hemoglobin: 12.2 g/dL (ref 12.0–15.0)
Immature Granulocytes: 0 %
Lymphocytes Relative: 20 %
Lymphs Abs: 1.6 10*3/uL (ref 0.7–4.0)
MCH: 29.8 pg (ref 26.0–34.0)
MCHC: 33 g/dL (ref 30.0–36.0)
MCV: 90.2 fL (ref 80.0–100.0)
Monocytes Absolute: 0.6 10*3/uL (ref 0.1–1.0)
Monocytes Relative: 7 %
Neutro Abs: 5.7 10*3/uL (ref 1.7–7.7)
Neutrophils Relative %: 72 %
Platelets: 208 10*3/uL (ref 150–400)
RBC: 4.1 MIL/uL (ref 3.87–5.11)
RDW: 13.4 % (ref 11.5–15.5)
WBC: 8 10*3/uL (ref 4.0–10.5)
nRBC: 0 % (ref 0.0–0.2)

## 2019-12-29 LAB — GLUCOSE, CAPILLARY
Glucose-Capillary: 121 mg/dL — ABNORMAL HIGH (ref 70–99)
Glucose-Capillary: 138 mg/dL — ABNORMAL HIGH (ref 70–99)
Glucose-Capillary: 139 mg/dL — ABNORMAL HIGH (ref 70–99)
Glucose-Capillary: 144 mg/dL — ABNORMAL HIGH (ref 70–99)
Glucose-Capillary: 145 mg/dL — ABNORMAL HIGH (ref 70–99)
Glucose-Capillary: 152 mg/dL — ABNORMAL HIGH (ref 70–99)
Glucose-Capillary: 167 mg/dL — ABNORMAL HIGH (ref 70–99)

## 2019-12-29 MED ORDER — PROMETHAZINE HCL 25 MG/ML IJ SOLN: 12.5000 mg | Freq: Four times a day (QID) | INTRAMUSCULAR | Status: DC | PRN

## 2019-12-29 MED ORDER — GABAPENTIN 100 MG PO CAPS: 200.0000 mg | ORAL_CAPSULE | Freq: Two times a day (BID) | ORAL | 0 refills | Status: DC

## 2019-12-29 MED ORDER — METFORMIN HCL 500 MG PO TABS: 500.0000 mg | ORAL_TABLET | Freq: Two times a day (BID) | ORAL | 1 refills | Status: DC

## 2019-12-29 MED ORDER — ONDANSETRON 4 MG PO TBDP: 4.0000 mg | ORAL_TABLET | Freq: Four times a day (QID) | ORAL | 0 refills | Status: DC | PRN

## 2019-12-29 MED FILL — GABAPENTIN 100 MG CAPSULE: 100 | 5 days supply | Qty: 20 | Fill #0

## 2019-12-29 MED FILL — ONDANSETRON ODT 4 MG TABLET: 4 | 21 days supply | Qty: 18 | Fill #0

## 2019-12-29 NOTE — Progress Notes (Signed)
Patient alert and oriented, Post op day 2.  Provided support and encouragement.  Encouraged pulmonary toilet, ambulation and small sips of liquids.  All questions answered.  Will continue to monitor. 

## 2019-12-29 NOTE — Progress Notes (Signed)
Nutrition Note  RD consulted for diet education for patient s/p bariatric surgery. Bariatric nurse coordinator providing education.  If nutrition issues arise, please consult RD.   Kissa Campoy, MS, RD, LDN Inpatient Clinical Dietitian Contact information available via Amion  

## 2019-12-29 NOTE — Progress Notes (Signed)
   Progress Note: Metabolic and Bariatric Surgery Service   Chief Complaint/Subjective: Initially nausea improved and tolerating liquids well however in the mid morning she felt more pain and weakness and nausea.  Objective: Vital signs in last 24 hours: Temp:  [98 F (36.7 C)-98.5 F (36.9 C)] 98 F (36.7 C) (12/15 0958) Pulse Rate:  [58-73] 63 (12/15 0958) Resp:  [16-18] 16 (12/15 0958) BP: (126-156)/(59-84) 143/77 (12/15 0958) SpO2:  [95 %-100 %] 95 % (12/15 0958) Last BM Date: 12/26/19  Intake/Output from previous day: 12/14 0701 - 12/15 0700 In: 3074.7 [P.O.:270; I.V.:2704.7; IV Piggyback:100] Out: 2950 [Urine:2950] Intake/Output this shift: Total I/O In: 60 [P.O.:60] Out: 600 [Urine:600]  Lungs: nonlabored  Cardiovascular: RRR  Abd: soft, incisions c/d/i  Extremities: no edema  Neuro: AOx4  Lab Results: CBC  Recent Labs    12/28/19 0439 12/29/19 0926  WBC 9.0 8.0  HGB 12.3 12.2  HCT 36.8 37.0  PLT 230 208   BMET Recent Labs    12/27/19 0933 12/28/19 0439  NA 135 138  K 4.4 4.1  CL 100 104  CO2 24 25  GLUCOSE 131* 160*  BUN 23* 12  CREATININE 0.78 0.79  CALCIUM 9.3 8.9   PT/INR No results for input(s): LABPROT, INR in the last 72 hours. ABG No results for input(s): PHART, HCO3 in the last 72 hours.  Invalid input(s): PCO2, PO2  Studies/Results:  Anti-infectives: Anti-infectives (From admission, onward)   Start     Dose/Rate Route Frequency Ordered Stop   12/27/19 0900  cefoTEtan (CEFOTAN) 2 g in sodium chloride 0.9 % 100 mL IVPB        2 g 200 mL/hr over 30 Minutes Intravenous On call to O.R. 12/27/19 0851 12/27/19 1123      Medications: Scheduled Meds: . acetaminophen  1,000 mg Oral Q8H   Or  . acetaminophen (TYLENOL) oral liquid 160 mg/5 mL  1,000 mg Oral Q8H  . citalopram  20 mg Oral Daily  . enoxaparin (LOVENOX) injection  30 mg Subcutaneous Q12H  . insulin aspart  0-20 Units Subcutaneous Q4H  . levothyroxine  100 mcg  Oral Daily  . Ensure Max Protein  2 oz Oral Q2H  . scopolamine  1 patch Transdermal On Call to OR   Continuous Infusions: . dextrose 5 % and 0.45% NaCl 125 mL/hr at 12/28/19 1720  . famotidine (PEPCID) IV 20 mg (12/29/19 0926)   PRN Meds:.hydrALAZINE, morphine injection, ondansetron (ZOFRAN) IV, oxyCODONE, simethicone  Assessment/Plan: Patient Active Problem List   Diagnosis Date Noted  . Morbid obesity ([place]) 12/27/2019  . Positive colorectal cancer screening using Cologuard test   . Diverticulosis of colon without hemorrhage   . Grade II hemorrhoids   . Numbness of foot 09/11/2011  . Hypothyroid 09/11/2011  . Diet-controlled diabetes mellitus ([place]) 10/02/2009  . Hyperlipidemia associated with type 2 diabetes mellitus ([place]) 10/02/2009  . ANXIETY STATE, UNSPECIFIED 09/04/2009  . PARESTHESIA 09/04/2009  . SNORING 09/04/2009  . VITAMIN D DEFICIENCY 08/31/2008  . HYPERKALEMIA 08/31/2008  . GERD 08/31/2008   s/p Procedure(s): LAPAROSCOPIC ROUX-EN-Y GASTRIC BYPASS WITH UPPER ENDOSCOPY UPPER GI ENDOSCOPY 12/27/2019 -continue fluids -continue antiemetics -will add phenergan for additional meds -hold off on discharge today  Disposition:  LOS: 2 days  The patient does not meet criteria for discharge because She has persistant nausea and vomiting and is at high risk of dehydration  Mickeal Skinner, MD [phone] [facility]

## 2019-12-29 NOTE — Progress Notes (Signed)
Patient not taking adequate fluid, nauseated.  Encouraged ambulation and increased small sips to decrease nausea.  IV Zofran provided.  Discussed above with Dr Kieth Brightly. Cancelled discharge.

## 2019-12-30 LAB — GLUCOSE, CAPILLARY
Glucose-Capillary: 129 mg/dL — ABNORMAL HIGH (ref 70–99)
Glucose-Capillary: 132 mg/dL — ABNORMAL HIGH (ref 70–99)
Glucose-Capillary: 112 mg/dL — ABNORMAL HIGH (ref 70–99)

## 2019-12-30 NOTE — Progress Notes (Signed)
PHARMACY CONSULT FOR:  Risk Assessment for Post-Discharge VTE Following Bariatric Surgery  Post-Discharge VTE Risk Assessment: This patient's probability of 30-day post-discharge VTE is increased due to the factors marked:   Female    Age >/=60 years    BMI >/=50 kg/m2    CHF    Dyspnea at Rest    Paraplegia   X Non-gastric-band surgery    Operation Time >/=3 hr    Return to OR    X Length of Stay >/= 3 d   Hx of VTE   Hypercoagulable condition   Significant venous stasis    Predicted probability of 30-day post-discharge VTE: 0.25%, mild  Recommendation for Discharge: No pharmacologic prophylaxis post-discharge      Lisa Patel is a 49 y.o. female who underwent  LAPAROSCOPIC ROUX-EN-Y GASTRIC BYPASS WITH UPPER ENDOSCOPY on 12/30/2019.   Case start: 1113 Case end: 1241   No Known Allergies  Patient Measurements: Height: 5\' 5"  (165.1 cm) Weight: 133.9 kg (295 lb 3.2 oz) IBW/kg (Calculated) : 57 Body mass index is 49.12 kg/m.  Recent Labs    12/27/19 1323 12/28/19 0439 12/29/19 0926  WBC  --  9.0 8.0  HGB 13.0 12.3 12.2  HCT 39.7 36.8 37.0  PLT  --  230 208  CREATININE  --  0.79  --   ALBUMIN  --  4.0  --   PROT  --  6.9  --   AST  --  25  --   ALT  --  19  --   ALKPHOS  --  46  --   BILITOT  --  0.6  --    Estimated Creatinine Clearance: 115.3 mL/min (by C-G formula based on SCr of 0.79 mg/dL).    Past Medical History:  Diagnosis Date  . Diabetes mellitus   . GERD (gastroesophageal reflux disease)   . Hyperlipidemia   . Hypertension   . Hypothyroidism   . Normal vaginal delivery    02-1993 and 2000 had epidural anesthesia  . Obesity   . Panic attack   . Seasonal allergies    seasonal  . Vitamin D deficiency      Medications Prior to Admission  Medication Sig Dispense Refill Last Dose  . acetaminophen (TYLENOL) 500 MG tablet Take 2,000 mg by mouth daily.   12/26/2019 at Unknown time  . citalopram (CELEXA) 20 MG tablet TAKE 1 TABLET(20  MG) BY MOUTH DAILY (Patient taking differently: Take 20 mg by mouth daily.) 90 tablet 3 12/26/2019 at Unknown time  . famotidine (PEPCID) 20 MG tablet Take 20 mg by mouth at bedtime as needed for heartburn or indigestion.   Past Week at Unknown time  . Inulin (FIBER CHOICE PREBIOTIC FIBER) 1.5 g CHEW Chew 3 tablets by mouth daily.   Past Week at Unknown time  . levothyroxine (SYNTHROID) 100 MCG tablet TAKE 1 TABLET(100 MCG) BY MOUTH DAILY (Patient taking differently: Take 100 mcg by mouth daily.) 90 tablet 3 12/26/2019 at Unknown time  . lisinopril (ZESTRIL) 5 MG tablet Take one by mouth daily (Patient taking differently: Take 5 mg by mouth daily.) 90 tablet 3 12/26/2019 at Unknown time  . metFORMIN (GLUCOPHAGE) 500 MG tablet TAKE 1 TABLET(500 MG) BY MOUTH DAILY (Patient taking differently: Take 500 mg by mouth daily.) 90 tablet 3 12/26/2019 at 0900  . montelukast (SINGULAIR) 10 MG tablet TAKE 1 TABLET(10 MG) BY MOUTH AT BEDTIME (Patient taking differently: Take 10 mg by mouth at bedtime.) 90 tablet 3 12/26/2019 at  Unknown time  . Multiple Vitamin (MULTIVITAMIN) tablet Take 1 tablet by mouth daily.   12/26/2019 at Unknown time  . omeprazole (PRILOSEC) 20 MG capsule Take 20 mg by mouth daily.   12/27/2019 at [place]  . prednisoLONE acetate (PRED FORTE) 1 % ophthalmic suspension Place 1 drop into the left eye daily as needed (inflame eyes).   Past Week at Unknown time  . simvastatin (ZOCOR) 20 MG tablet TAKE 1 TABLET(20 MG) BY MOUTH AT BEDTIME (Patient taking differently: Take 20 mg by mouth at bedtime.) 90 tablet 3 12/26/2019 at Unknown time  . VITAMIN D PO Take 1 capsule by mouth daily.   12/26/2019 at Unknown time  . diphenhydramine-acetaminophen (TYLENOL PM) 25-500 MG TABS tablet Take 2 tablets by mouth at bedtime.   12/25/2019  . loratadine-pseudoephedrine (CLARITIN-D 24-HOUR) 10-240 MG 24 hr tablet Take 1 tablet by mouth daily as needed for allergies.   More than a month at Unknown time     Royetta Asal, PharmD, BCPS 12/30/2019 11:53 AM

## 2019-12-30 NOTE — Progress Notes (Signed)
Patient alert and oriented, Post op day 3.  Provided support and encouragement.  Encouraged pulmonary toilet, ambulation and small sips of liquids.  Completed protein shake overnight, working on clear fluids.  Denies nausea and pain. Ambulated hallway multiple times through night and this morning.  All questions answered.  Will continue to monitor.

## 2020-01-03 ENCOUNTER — Telehealth: Payer: Self-pay | Admitting: Skilled Nursing Facility1

## 2020-01-03 NOTE — Telephone Encounter (Signed)
Any seasoning should be fine.  From: Renaldo [email or web address]>  Sent: Monday, January 03, 2020 11:03 AM To: Sandie [email or web address] [email or web address]> Subject: Re: Post Bariatirc Diet Week 2  *Caution - External email - see footer for warnings* Thank you, I had researched other soups but was not sure.  One last question, are there any seasonings, like ground pepper, that I can use to season? [name]    From: Roldan Laforest [email or web address]> Sent: Monday, January 03, 2020 9:01 AM To: Islay [email or web address]> Subject: RE: Art therapist Diet Week 2    Try: unflavored protein powder in: hot tea, decaf coffee, make it into jello for a different texture. You could also try other savory flavored rptoein pwders at the sites below:   [email or web address]-[email or web address]-[email or web address]-protein-[email or web address]   [email or web address]   From: Ranada [email or web address] [email or web address]>  Sent: Sunday, January 02, 2020 9:33 PM To: Chirstopher Iovino [email or web address]> Subject: Art therapist Diet Week 2   *Caution - External email - see footer for warnings*  Hello.  My name is Lisa Patel Nov 14, 2070.  My doctor is Photographer.  I had my gastric bypass surgery on Monday 12/27/19. I have my follow-up appt with your office on 01/11/20.   My question is this:  I am having extreme difficulty getting my protein in daily.  I am not a sweet eater, and the thought of having another shake, popsicle, yogurt makes me so nauseous.  I tried adding plain protein powder to chicken and beef soup and even they are now making me sick to my stomach.  I know I cannot be the only person who has experienced this.  Are there any tips or tricks that are safe that you can recommend to help me get these things down until I hopefully move into the next phase after our appt?   Thank you,   [name]

## 2020-01-04 ENCOUNTER — Telehealth (HOSPITAL_COMMUNITY): Payer: Self-pay

## 2020-01-04 NOTE — Telephone Encounter (Signed)
Patient called to discuss post bariatric surgery follow up questions.  See below:   1.  Tell me about your pain and pain management?denies  2.  Let's talk about fluid intake.  How much total fluid are you taking in?not measuring fluid, thinks around 64 ounces  3.  How much protein have you taken in the last 2 days?60 grams  4.  Have you had nausea?  Tell me about when have experienced nausea and what you did to help?nausea after some soup today, encouraged zofran  5.  Has the frequency or color changed with your urine?urine is lighter in color  6.  Tell me what your incisions look like?denies  7.  Have you been passing gas? BM?small bm since discharge taking MOM  8.  If a problem or question were to arise who would you call?  Do you know contact numbers for [place], [facility], and [facility]?aware of how to contact services  9.  How has the walking going?walking around the house  10.  How are your vitamins and calcium going?  How are you taking them?calcium started has not started [facility]

## 2020-01-11 ENCOUNTER — Encounter: Payer: BC Managed Care – PPO | Attending: General Surgery | Admitting: Skilled Nursing Facility1

## 2020-01-11 ENCOUNTER — Other Ambulatory Visit: Payer: Self-pay

## 2020-01-11 DIAGNOSIS — E119 Type 2 diabetes mellitus without complications: Secondary | ICD-10-CM | POA: Insufficient documentation

## 2020-01-11 NOTE — Progress Notes (Signed)
2 Week Post-Operative Nutrition Class   Patient was seen on 03/10/18 for Post-Operative Nutrition education at [facility].   Fluid ounces reported 64. Pt states she has been feeling shaky and sweaty. Pt states she does not check her blood sugars; checked in office 147 glucose reading.    Surgery date: 12/27/2019 Surgery type: RYGB Start weight at [facility]: 310.1 Weight today: 280.2   Body Composition Scale 01/11/2020  Total Body Fat % 47.8  Visceral Fat 19  Fat-Free Mass % 52.1   Total Body Water % 40.5   Muscle-Mass lbs 32.2  Body Fat Displacement          Torso  lbs 83.1         Left Leg  lbs 16.6         Right Leg  lbs 16.6         Left Arm  lbs 8.3         Right Arm   lbs 8.3     The following the learning objectives were met by the patient during this course:  Identifies Phase 3 (Soft, High Proteins) Dietary Goals and will begin from 2 weeks post-operatively to 2 months post-operatively  Identifies appropriate sources of fluids and proteins   Identifies appropriate fat sources and healthy verses unhealthy fat types    States protein recommendations and appropriate sources post-operatively  Identifies the need for appropriate texture modifications, mastication, and bite sizes when consuming solids  Identifies appropriate multivitamin and calcium sources post-operatively  Describes the need for physical activity post-operatively and will follow MD recommendations  States when to call healthcare provider regarding medication questions or post-operative complications   Handouts given during class include:  Phase 3A: Soft, High Protein Diet Handout  Phase 3 High Protein Meals  Healthy Fats   Follow-Up Plan: Patient will follow-up at [facility] in 6 weeks for 2 month post-op nutrition visit for diet advancement per MD.

## 2020-01-14 ENCOUNTER — Encounter: Payer: Self-pay | Admitting: Family Medicine

## 2020-01-17 NOTE — Discharge Summary (Signed)
Physician Discharge Summary  Lisa Patel HYQ:[id number] DOB: Jan 04, 1971 DOA: 12/27/2019  PCP: Pearline Cables, MD  Admit date: 12/27/2019 Discharge date: 01/17/2020  Recommendations for Outpatient Follow-up:  1. none (include homehealth, outpatient follow-up instructions, specific recommendations for PCP to follow-up on, etc.)   Follow-up Information    Taziyah Iannuzzi, [place], MD. Go on 01/26/2020.   Specialty: General Surgery Why: at 10 am.   Please arrive 15 minutes prior to appointment time.  Thank you Contact information: [address] [place] [address] [phone]        [facility], [place]. Go on 02/16/2020.   Specialty: General Surgery Why: at 910 am.  Please arrive 15 minutes prior to appointment time.  Thank you Contact information: [address] [place] [address] [phone]              Discharge Diagnoses:  Active Problems:   Morbid obesity (HCC)   Surgical Procedure: Roux-en-Y gastric bypass, upper endoscopy  Discharge Condition: Good Disposition: Home  Diet recommendation: Postoperative sleeve gastrectomy diet (liquids only)  Filed Weights   12/23/19 1038 12/27/19 0853  Weight: (!) 138.3 kg 133.9 kg     Hospital Course:  The patient was admitted after undergoing Roux-en-Y gastric bypass. POD 0 she ambulated well. POD 1 she was started on the water diet protocol and tolerated 200 ml in the first shift. Once meeting the water amount she was advanced to bariatric protein shakes which they tolerated and were discharged home POD 3.  Treatments: surgery: Roux-en-Y gastric bypass  Discharge Instructions  Discharge Instructions    Ambulate hourly while awake   Complete by: As directed    Call MD for:  difficulty breathing, headache or visual disturbances   Complete by: As directed    Call MD for:  persistant dizziness or light-headedness   Complete by: As directed    Call MD for:  persistant nausea and  vomiting   Complete by: As directed    Call MD for:  redness, tenderness, or signs of infection (pain, swelling, redness, odor or green/yellow discharge around incision site)   Complete by: As directed    Call MD for:  severe uncontrolled pain   Complete by: As directed    Call MD for:  temperature >101 F   Complete by: As directed    Diet bariatric full liquid   Complete by: As directed    Discharge wound care:   Complete by: As directed    Remove Bandaids tomorrow, ok to shower tomorrow. Steristrips may fall off in 1-3 weeks.   Incentive spirometry   Complete by: As directed    Perform hourly while awake   No dressing needed   Complete by: As directed      Allergies as of 12/30/2019   No Known Allergies     Medication List    STOP taking these medications   diphenhydramine-acetaminophen 25-500 MG Tabs tablet Commonly known as: TYLENOL PM   famotidine 20 MG tablet Commonly known as: PEPCID     TAKE these medications   acetaminophen 500 MG tablet Commonly known as: TYLENOL Take 2,000 mg by mouth daily.   citalopram 20 MG tablet Commonly known as: CELEXA TAKE 1 TABLET(20 MG) BY MOUTH DAILY What changed:   how much to take  how to take this  when to take this  additional instructions   Fiber Choice Prebiotic Fiber 1.5 g Chew Generic drug: Inulin Chew 3 tablets by mouth daily.  gabapentin 100 MG capsule Commonly known as: NEURONTIN Take 2 capsules (200 mg total) by mouth every 12 (twelve) hours.   levothyroxine 100 MCG tablet Commonly known as: SYNTHROID TAKE 1 TABLET(100 MCG) BY MOUTH DAILY What changed:   how much to take  how to take this  when to take this  additional instructions   lisinopril 5 MG tablet Commonly known as: ZESTRIL Take one by mouth daily What changed:   how much to take  how to take this  when to take this  additional instructions   loratadine-pseudoephedrine 10-240 MG 24 hr tablet Commonly known as: CLARITIN-D  24-hour Take 1 tablet by mouth daily as needed for allergies.   metFORMIN 500 MG tablet Commonly known as: Glucophage Take 1 tablet (500 mg total) by mouth 2 (two) times daily with a meal. What changed:   how much to take  how to take this  when to take this  additional instructions   montelukast 10 MG tablet Commonly known as: SINGULAIR TAKE 1 TABLET(10 MG) BY MOUTH AT BEDTIME What changed:   how much to take  how to take this  when to take this  additional instructions   multivitamin tablet Take 1 tablet by mouth daily.   omeprazole 20 MG capsule Commonly known as: PRILOSEC Take 20 mg by mouth daily.   ondansetron 4 MG disintegrating tablet Commonly known as: ZOFRAN-ODT Take 1 tablet (4 mg total) by mouth every 6 (six) hours as needed for nausea or vomiting.   prednisoLONE acetate 1 % ophthalmic suspension Commonly known as: PRED FORTE Place 1 drop into the left eye daily as needed (inflame eyes).   simvastatin 20 MG tablet Commonly known as: ZOCOR TAKE 1 TABLET(20 MG) BY MOUTH AT BEDTIME What changed:   how much to take  how to take this  when to take this  additional instructions   VITAMIN D PO Take 1 capsule by mouth daily.            Discharge Care Instructions  (From admission, onward)         Start     Ordered   12/30/19 0000  No dressing needed        12/30/19 1310   12/29/19 0000  Discharge wound care:       Comments: Remove Bandaids tomorrow, ok to shower tomorrow. Steristrips may fall off in 1-3 weeks.   12/29/19 0848          Follow-up Information    Laurelai Lepp, Arta Bruce, MD. Go on 01/26/2020.   Specialty: General Surgery Why: at 10 am.   Please arrive 15 minutes prior to appointment time.  Thank you Contact information: [place] [address] [phone]        Surgery, [place]. Go on 02/16/2020.   Specialty: General Surgery Why: at 910 am.  Please arrive 15 minutes prior to  appointment time.  Thank you Contact information: [place] [address] [phone]                The results of significant diagnostics from this hospitalization (including imaging, microbiology, ancillary and laboratory) are listed below for reference.    Significant Diagnostic Studies: No results found.  Labs: Basic Metabolic Panel: No results for input(s): NA, K, CL, CO2, GLUCOSE, BUN, CREATININE, CALCIUM, MG, PHOS in the last 168 hours. Liver Function Tests: No results for input(s): AST, ALT, ALKPHOS, BILITOT, PROT, ALBUMIN in the last  168 hours.  CBC: No results for input(s): WBC, NEUTROABS, HGB, HCT, MCV, PLT in the last 168 hours.  CBG: No results for input(s): GLUCAP in the last 168 hours.  Active Problems:   Morbid obesity ([place])   VTE plan: no chemical prophylaxis recommended ([email or web address])  Time coordinating discharge: 15 min

## 2020-01-18 ENCOUNTER — Telehealth: Payer: Self-pay | Admitting: Skilled Nursing Facility1

## 2020-01-18 NOTE — Telephone Encounter (Signed)
RD called pt to verify fluid intake once starting soft, solid proteins 2 week post-bariatric surgery.   Daily Fluid intake: 64 oz Daily Protein intake: 40 g  Concerns/issues:   Pt states she experiences nausea and intestinal cramps. Pt states she had to do an enema for constipation. Pt states she is taking 45 minutes to eat, chewing well and still experineces the pain. Pt states spicy foods do not do well.   Dietitian advised pt lean on plant based proteins and seafood to get her protein as it seems to be animal based products.  Dietitian advised pt to drink protein shakes to reach her 60 gram goal.

## 2020-01-18 NOTE — Progress Notes (Addendum)
[facility] at [facility] [address] [place], [place] [address] [phone]  Date:  01/20/2020   Name:  Lisa Patel   DOB:  08-12-1970   MRN:  [id number]  PCP:  Pearline Cables, MD    Chief Complaint: Illness (No energy, trouble breathing, shakiness, happens frequently)   History of Present Illness:  Lisa Patel is a 50 y.o. very pleasant female patient who presents with the following:  History of controlled DM, hyperlipidemia, hypothyroidism, obesity Patient with recent history of bariatric surgery- she underwent her operation on 12/13 She contacted me recently with the following:  This coming Monday will be three weeks out form my surgery.  I have been reintroducing sold food since Tuesday.  I have been having episodes of extreme shakiness and exhaustion.  I thought it was maybe blood sugar but was told by a family member in the medical field that it sounded like it could also be blood pressure related.  I am currently still taking all my prescription meds.  I am not sure when you would normally want my first visit to you to be, so I am reaching out.     Additionally, I would like you to look an area on my back.  It is like an area that is raised beneath the skin, for many weeks, but growing all of a sudden.  Please let me know if I should schedule an appt.  Wt Readings from Last 3 Encounters:  01/20/20 270 lb (122.5 kg)  01/11/20 280 lb 3.2 oz (127.1 kg)  12/27/19 295 lb 3.2 oz (133.9 kg)   The patient feel lightheaded and shaky- she has noted this frequently, since her surgery  She is down 25 lbs already- 35 from November when she began weight loss with nutrition therapy  She is taking metformin once a day, also losartan 5 mg Her glucose has not seemed to be low   She is getting about 500 calories a day- it is mostly protein,  However she does not feel like she is hitting her protein goals or getting enough  nutrition, she is not able to tolerate much food  She is not getting enough liquids but she is trying  She is drinking crystal light, hot tea   Lab Results  Component Value Date   HGBA1C 6.8 (H) 12/27/2019   Yesterday she felt pretty well, ate a little bit more food and then had a sugar-free popsicle.  This seemed to trigger quite a bit of discomfort and diarrhea.  We wonder if the sugar-free popsicle may have contained sugar alcohols which can cause upset stomach Patient Active Problem List   Diagnosis Date Noted  . Morbid obesity (HCC) 12/27/2019  . Positive colorectal cancer screening using Cologuard test   . Diverticulosis of colon without hemorrhage   . Grade II hemorrhoids   . Numbness of foot 09/11/2011  . Hypothyroid 09/11/2011  . Diet-controlled diabetes mellitus (HCC) 10/02/2009  . Hyperlipidemia associated with type 2 diabetes mellitus (HCC) 10/02/2009  . ANXIETY STATE, UNSPECIFIED 09/04/2009  . PARESTHESIA 09/04/2009  . SNORING 09/04/2009  . VITAMIN D DEFICIENCY 08/31/2008  . HYPERKALEMIA 08/31/2008  . GERD 08/31/2008    Past Medical History:  Diagnosis Date  . Diabetes mellitus   . GERD (gastroesophageal reflux disease)   . Hyperlipidemia   . Hypertension   . Hypothyroidism   . Normal vaginal delivery    02-1993 and 2000 had epidural anesthesia  .  Obesity   . Panic attack   . Seasonal allergies    seasonal  . Vitamin D deficiency     Past Surgical History:  Procedure Laterality Date  . COLONOSCOPY WITH PROPOFOL N/A 01/18/2019   Procedure: COLONOSCOPY WITH PROPOFOL;  Surgeon: [name], DO;  Location: [facility];  Service: Gastroenterology;  Laterality: N/A;  . GASTRIC ROUX-EN-Y N/A 12/27/2019   Procedure: LAPAROSCOPIC ROUX-EN-Y GASTRIC BYPASS WITH UPPER ENDOSCOPY;  Surgeon: Kieth Brightly, Arta Bruce, MD;  Location: [facility];  Service: General;  Laterality: N/A;  . UPPER GI ENDOSCOPY N/A 12/27/2019   Procedure: UPPER GI ENDOSCOPY;  Surgeon: Kieth Brightly  Arta Bruce, MD;  Location: [facility];  Service: General;  Laterality: N/A;    Social History   Tobacco Use  . Smoking status: Never Smoker  . Smokeless tobacco: Never Used  Vaping Use  . Vaping Use: Never used  Substance Use Topics  . Alcohol use: Not Currently  . Drug use: Never    Family History  Problem Relation Age of Onset  . Diabetes Maternal Grandmother   . Clotting disorder Maternal Grandmother   . Diabetes Maternal Grandfather   . Diabetes Paternal Grandmother   . Diabetes Paternal Grandfather   . Colon cancer Neg Hx   . Esophageal cancer Neg Hx   . Colon polyps Neg Hx   . Rectal cancer Neg Hx   . Stomach cancer Neg Hx     No Known Allergies  Medication list has been reviewed and updated.  Current Outpatient Medications on File Prior to Visit  Medication Sig Dispense Refill  . acetaminophen (TYLENOL) 500 MG tablet Take 2,000 mg by mouth daily.    . citalopram (CELEXA) 20 MG tablet TAKE 1 TABLET(20 MG) BY MOUTH DAILY (Patient taking differently: Take 20 mg by mouth daily.) 90 tablet 3  . Inulin (FIBER CHOICE PREBIOTIC FIBER) 1.5 g CHEW Chew 3 tablets by mouth daily.    Marland Kitchen levothyroxine (SYNTHROID) 100 MCG tablet TAKE 1 TABLET(100 MCG) BY MOUTH DAILY (Patient taking differently: Take 100 mcg by mouth daily.) 90 tablet 3  . lisinopril (ZESTRIL) 5 MG tablet Take one by mouth daily (Patient taking differently: Take 5 mg by mouth daily.) 90 tablet 3  . metFORMIN (GLUCOPHAGE) 500 MG tablet Take 1 tablet (500 mg total) by mouth 2 (two) times daily with a meal. 60 tablet 1  . montelukast (SINGULAIR) 10 MG tablet TAKE 1 TABLET(10 MG) BY MOUTH AT BEDTIME (Patient taking differently: Take 10 mg by mouth at bedtime.) 90 tablet 3  . omeprazole (PRILOSEC) 20 MG capsule Take 20 mg by mouth daily.    . ondansetron (ZOFRAN-ODT) 4 MG disintegrating tablet Take 1 tablet (4 mg total) by mouth every 6 (six) hours as needed for nausea or vomiting. 20 tablet 0  . simvastatin (ZOCOR) 20 MG  tablet TAKE 1 TABLET(20 MG) BY MOUTH AT BEDTIME (Patient taking differently: Take 20 mg by mouth at bedtime.) 90 tablet 3   No current facility-administered medications on file prior to visit.    Review of Systems:  As per HPI- otherwise negative.   Physical Examination: Vitals:   01/20/20 1602  BP: 108/70  Pulse: 92  Resp: 18  SpO2: 94%   Vitals:   01/20/20 1602  Weight: 270 lb (122.5 kg)  Height: 5\' 5"  (1.651 m)   Body mass index is 44.93 kg/m. Ideal Body Weight: Weight in (lb) to have BMI = 25: 149.9  GEN: no acute distress.  Obese, looks well.  Blood pressure borderline low HEENT: Atraumatic, Normocephalic.  Ears and Nose: No external deformity. CV: RRR, No M/G/R. No JVD. No thrill. No extra heart sounds. PULM: CTA B, no wheezes, crackles, rhonchi. No retractions. No resp. distress. No accessory muscle use. ABD: S, NT, ND, +BS. No rebound. No HSM. EXTR: No c/c/e PSYCH: Normally interactive. Conversant.  She also notes a skin concern on her left posterior shoulder.  She noted 2 adjacent bumps, they seem to become more tender recently.  On exam, she actually has 2 areas approximate 1 cm diameter which sink in with pressure on the posterior shoulder.  I am not certain what these are  Assessment and Plan: Lightheaded  Controlled type 2 diabetes mellitus without complication, without long-term current use of insulin (HCC)  Hypothyroidism (acquired) - Plan: TSH  Gastric bypass status for obesity - Plan: CBC, Comprehensive metabolic panel, B12 and Folate Panel, Ferritin, Vitamin B1  Skin lesion - Plan: Ambulatory referral to Dermatology  Lisa Patel is here today to follow-up from recent gastric bypass surgery.  She has lost 25 pounds since December 13.  She is doing her best to consume enough protein and liquids, but this is a challenge.  I suspect that her symptoms of lightheadedness and fatigue are due to borderline low blood pressure and rapid weight loss  Advised her  to stop lisinopril, stop metformin. We will obtain lab work-up as above  We discussed strategies to help her consume more liquids and enough electrolytes to support her blood pressure  Referral to dermatology for area on her shoulder  I have asked her to keep me closely posted about her condition This visit occurred during the SARS-CoV-2 public health emergency.  Safety protocols were in place, including screening questions prior to the visit, additional usage of staff PPE, and extensive cleaning of exam room while observing appropriate contact time as indicated for disinfecting solutions.    Signed Abbe Amsterdam, MD   Received her labs 1/7- message to pt  Results for orders placed or performed in visit on 01/20/20  CBC  Result Value Ref Range   WBC 8.8 4.0 - 10.5 K/uL   RBC 4.83 3.87 - 5.11 Mil/uL   Platelets 230.0 150.0 - 400.0 K/uL   Hemoglobin 14.2 12.0 - 15.0 g/dL   HCT 82.9 93.7 - 16.9 %   MCV 87.5 78.0 - 100.0 fl   MCHC 33.6 30.0 - 36.0 g/dL   RDW 67.8 93.8 - 10.1 %  Comprehensive metabolic panel  Result Value Ref Range   Sodium 133 (L) 135 - 145 mEq/L   Potassium 4.7 3.5 - 5.1 mEq/L   Chloride 98 96 - 112 mEq/L   CO2 21 19 - 32 mEq/L   Glucose, Bld 108 (H) 70 - 99 mg/dL   BUN 27 (H) 6 - 23 mg/dL   Creatinine, Ser 7.51 0.40 - 1.20 mg/dL   Total Bilirubin 0.7 0.2 - 1.2 mg/dL   Alkaline Phosphatase 62 39 - 117 U/L   AST 20 0 - 37 U/L   ALT 9 0 - 35 U/L   Total Protein 7.9 6.0 - 8.3 g/dL   Albumin 4.6 3.5 - 5.2 g/dL   GFR 02.58 >52.77 mL/min   Calcium 10.2 8.4 - 10.5 mg/dL  TSH  Result Value Ref Range   TSH 2.79 0.35 - 4.50 uIU/mL  B12 and Folate Panel  Result Value Ref Range   Vitamin B-12 >1526 (H) 211 - 911 pg/mL   Folate >23.6 >5.9 ng/mL  Ferritin  Result Value Ref Range   Ferritin 107.1 10.0 - 291.0 ng/mL

## 2020-01-19 ENCOUNTER — Other Ambulatory Visit: Payer: Self-pay

## 2020-01-20 ENCOUNTER — Ambulatory Visit (INDEPENDENT_AMBULATORY_CARE_PROVIDER_SITE_OTHER): Payer: BC Managed Care – PPO | Admitting: Family Medicine

## 2020-01-20 ENCOUNTER — Encounter: Payer: Self-pay | Admitting: Family Medicine

## 2020-01-20 VITALS — BP 108/70 | HR 92 | Resp 18 | Ht 65.0 in | Wt 270.0 lb

## 2020-01-20 DIAGNOSIS — E039 Hypothyroidism, unspecified: Secondary | ICD-10-CM

## 2020-01-20 DIAGNOSIS — R42 Dizziness and giddiness: Secondary | ICD-10-CM | POA: Diagnosis not present

## 2020-01-20 DIAGNOSIS — Z9884 Bariatric surgery status: Secondary | ICD-10-CM

## 2020-01-20 DIAGNOSIS — E119 Type 2 diabetes mellitus without complications: Secondary | ICD-10-CM | POA: Diagnosis not present

## 2020-01-20 DIAGNOSIS — L989 Disorder of the skin and subcutaneous tissue, unspecified: Secondary | ICD-10-CM

## 2020-01-20 NOTE — Patient Instructions (Addendum)
Ok to stop lisinopril Ok to stop metformin I will be in touch with your labs asap!   Try to increase your hydration and perhaps add in some gatorade to increase absorption of hydration  Please let me know if you are not feeling better soon

## 2020-01-21 ENCOUNTER — Telehealth: Payer: Self-pay | Admitting: Skilled Nursing Facility1

## 2020-01-21 ENCOUNTER — Encounter: Payer: Self-pay | Admitting: Family Medicine

## 2020-01-21 LAB — FERRITIN: Ferritin: 107.1 ng/mL (ref 10.0–291.0)

## 2020-01-21 LAB — COMPREHENSIVE METABOLIC PANEL
ALT: 9 U/L (ref 0–35)
AST: 20 U/L (ref 0–37)
Albumin: 4.6 g/dL (ref 3.5–5.2)
Alkaline Phosphatase: 62 U/L (ref 39–117)
BUN: 27 mg/dL — ABNORMAL HIGH (ref 6–23)
CO2: 21 mEq/L (ref 19–32)
Calcium: 10.2 mg/dL (ref 8.4–10.5)
Chloride: 98 mEq/L (ref 96–112)
Creatinine, Ser: 1.02 mg/dL (ref 0.40–1.20)
GFR: 63.68 mL/min (ref >60.00)
Glucose, Bld: 108 mg/dL — ABNORMAL HIGH (ref 70–99)
Potassium: 4.7 mEq/L (ref 3.5–5.1)
Sodium: 133 mEq/L — ABNORMAL LOW (ref 135–145)
Total Bilirubin: 0.7 mg/dL (ref 0.2–1.2)
Total Protein: 7.9 g/dL (ref 6.0–8.3)

## 2020-01-21 LAB — CBC
HCT: 42.3 % (ref 36.0–46.0)
Hemoglobin: 14.2 g/dL (ref 12.0–15.0)
MCHC: 33.6 g/dL (ref 30.0–36.0)
MCV: 87.5 fl (ref 78.0–100.0)
Platelets: 230 10*3/uL (ref 150.0–400.0)
RBC: 4.83 Mil/uL (ref 3.87–5.11)
RDW: 14.3 % (ref 11.5–15.5)
WBC: 8.8 10*3/uL (ref 4.0–10.5)

## 2020-01-21 LAB — TSH: TSH: 2.79 u[IU]/mL (ref 0.35–4.50)

## 2020-01-21 LAB — B12 AND FOLATE PANEL
Folate: >23.6 ng/mL (ref >5.9)
Vitamin B-12: >1526 pg/mL — ABNORMAL HIGH (ref 211–911)

## 2020-01-21 NOTE — Telephone Encounter (Signed)
Pt called to discuss what her doctor had advised.  Dietitian read her doctors advice to her and stated it would be okay to do the G2 to help her hydrate since she has had trouble getting in her 64 fluid ounces.  Monitor for dumping although that is unlikely.

## 2020-01-21 NOTE — Telephone Encounter (Signed)
Alrighty from the top:  Veggies: it is always tricky when eating out because you do not know how it is prepared and what they added to it, so that is my first caution. Second caution if all proteins have gone without any issue thus far vegetables should go okay for this one day again because they will be prepared outside of your home, they may add too much butter or something. AVOID RAW VEGETABLES MAKE SURE THEY ARE COOKED WELL. WHICH MEANS DO NOT EAT A SALAD  Where to eat: Without knowing what city or what you and your people like I cannot give a restaurant recommendation. I recommend googling some places you were considering to look at their menu ahead of time and pre-choose your dish. A broiled fish with broccoli dish would be optimal. If the restaurant you choose has chips or bread set out on the table, I would avoid eating those.  Fruit: Adding in fruit at this point has the potential to slow your weight loss journey so I do not recommend that. I have had pts add sugar free jello or a sugar free flavoring such as MIO drops to their cottage cheese  Low sugar: I am cool with G2 to pick up your blood sugar (if it is below 70) but I do not understand what your sugars have been? Are we talking about your blood pressure or blood sugar? I would like more information about that so I can better help you.  If you need to increase your blood SUGAR zero sugar products would not do that, fake sugar does not enter your blood stream.   Maybe a phone conversation would be best?  -----Original Message----- From: Lisa Patel [email or web address]>  Sent: Thursday, January 20, 2020 6:38 PM To: Lisa Patel [email or web address]> Subject: Veggies  *Caution - External email - see footer for warnings*  Hello!! Sorry about bombarding you.  I am going out to lunch on the 15th. I am looking for a place that has options at this stage of my diet plan. I know we are not supposed to eat vegetables  yet but if it was just this once could I have some if low carb like broccoli, carrots, lettuce, peppers, onions??  Also are there any fruits that could be added to cottage cheese in small, small amounts to give it a change in flavor, or anything else you recommend others have done to spice up cottage cheese. Lastly, my GP suggested I drink low sugar Gatorade G2 to help with my low blood pressure issues post surgery 108/70 at todays appt. I wasn't sure if that was ok??  7g of sugar seemed like a lot. Do you think the zero sugar type would still accomplish what she was looking for?    Thank you, Lisa Patel

## 2020-01-25 LAB — VITAMIN B1: Vitamin B1 (Thiamine): 19 nmol/L (ref 8–30)

## 2020-02-01 ENCOUNTER — Telehealth: Payer: Self-pay | Admitting: Skilled Nursing Facility1

## 2020-02-01 NOTE — Telephone Encounter (Signed)
Hello I am reaching out again with the next round of questions.  I am still struggling to get my protein in.  Almost everything makes we nauseous here and there.  The only thing I can tolerate is lowfat cheese slices, chicken broth and peanuts.  I work to find a way to get an egg in most days.  Deli meat and cottage cheese brings me close to vomiting.  I do not like any fish or seafood.  I can sometimes get down chicken salad.  Sometimes the mayo is just too much.  Chicken burgers and hamburgers are ok, but after a few times, I experience burnout.  Most nights by the time dinner rolls around my desire to eat is so low that I just don't.    My next appt with your office is the 10th I am really struggling to get there.  Are there any tips you can share?  Thank you, Lisa Patel     Ya I understand that. When there is struggle it makes it difficult to have much of an appetite. It sounds like you have done a really great job with trying to expand your palate and try new things. The best plan of action would be to stick to the foods that do not make you nauseous and keep trying new recipes to avoid burnout with eating the same thing every day,   Ensure you are getting in the minimum 64 fluid ounces each day as that will make things more difficult if you do not reach that goal.   Continue to eat the things you know you can tolerate and try beans, lentils, and plant-based proteins (Gardein, morning star farms, [employer], [employer]) in the frozen food aisle to add more variety.   Below are 3 websites to help with recipe ideas: They all have a search bar at the top to search whatever meat you have   https://recipes.NeedCharge.es  GoldStates.com.pt  Diabetes Food Hub From the nutrition experts at the [employer], Diabetes Food Hub is the premier food and cooking destination for people living with diabetes and their  families. WholePrices.ch   SecurityAd.es

## 2020-02-23 ENCOUNTER — Ambulatory Visit: Payer: BC Managed Care – PPO | Admitting: Skilled Nursing Facility1

## 2020-02-24 ENCOUNTER — Encounter: Payer: BC Managed Care – PPO | Attending: General Surgery | Admitting: Skilled Nursing Facility1

## 2020-02-24 ENCOUNTER — Other Ambulatory Visit: Payer: Self-pay

## 2020-02-24 DIAGNOSIS — E119 Type 2 diabetes mellitus without complications: Secondary | ICD-10-CM | POA: Insufficient documentation

## 2020-02-24 NOTE — Progress Notes (Signed)
Bariatric Nutrition Follow-Up Visit Medical Nutrition Therapy   NUTRITION ASSESSMENT   Anthropometrics  Surgery date: 12/27/2019 Surgery type: RYGB Start weight at [facility]: 310.1 Weight today: 270.8   Body Composition Scale 01/11/2020 02/24/2020  Total Body Fat % 47.8 47  Visceral Fat 19 18  Fat-Free Mass % 52.1 52.9   Total Body Water % 40.5 40.9   Muscle-Mass lbs 32.2 32.1  Body Fat Displacement           Torso  lbs 83.1 79.1         Left Leg  lbs 16.6 15.8         Right Leg  lbs 16.6 15.8         Left Arm  lbs 8.3 7.9         Right Arm   lbs 8.3 7.9   Clinical  Medical hx: anxiety, diabetes Medications: taken off lisinopril and metformin  Labs:    Lifestyle & Dietary Hx  Pt states she never has checked her blood sugars.  Pt states she knows she is not supposed to be eating rice cakes.  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 Pt states she has to fore herself to eat because she has no appetite. Pt states she works 2 jobs stating she has not included physical activity because of that.   Estimated daily fluid intake: 40 oz Estimated daily protein intake: 65+ g Supplements: multivitamin and 2 calcium  Current average weekly physical activity: ADL's  24-Hr Dietary Recall First Meal: half rice cake, half [place] sausage, half slice of cheese Snack: 2 ounces cheese Second Meal: half chicken burger + cheese or [place] meatballs + sauce + cheese Snack: peanuts Third Meal: fried egg or [place] balls  Snack:  Beverages: water, water + flavoring, diet cranberry juice   Post-Op Goals/ Signs/ Symptoms Using straws: no Drinking while eating: no Chewing/swallowing difficulties: no Changes in vision: no Changes to mood/headaches: no Hair loss/changes to skin/nails: no Difficulty focusing/concentrating: no Sweating:  no Dizziness/lightheadedness: no Palpitations: no  Carbonated/caffeinated beverages: no N/V/D/C/Gas: daily mirilax Abdominal pain: no Dumping syndrome: no  NUTRITION DIAGNOSIS  Overweight/obesity ([place]-3.3) related to past poor dietary habits and physical inactivity as evidenced by completed bariatric surgery and following dietary guidelines for continued weight loss and healthy nutrition status.   NUTRITION INTERVENTION Nutrition counseling (C-1) and education (E-2) to facilitate bariatric surgery goals, including: . Diet advancement to the next phase (phase 4) now including non starchy vegetables . The importance of consuming adequate calories as well as certain nutrients daily due to the body's need for essential vitamins, minerals, and fats . The importance of daily physical activity and to reach a goal of at least 150 minutes of moderate to vigorous physical activity weekly (or as directed by their physician) due to benefits such as increased musculature and improved lab values . The importance of intuitive eating specifically learning hunger-satiety cues and understanding the importance of learning a new body: The importance of mindful eating to avoid grazing behaviors   Goals: -try at least a 2 minute workout from an app on your phone daily -Continue to aim for a minimum of 64 fluid ounces 7 days a week with at least 30 ounces being plain water -Eat non-starchy vegetables 2 times a day 7 days a week -Start  out with soft cooked vegetables today and tomorrow; if tolerated begin to eat raw vegetables or cooked including salads -Eat your 3 ounces of protein first then start in on your non-starchy vegetables; once you understand how much of your meal leads to satisfaction and not full while still eating 3 ounces of protein and non-starchy vegetables you can eat them in any order  -Continue to aim for 30 minutes of activity at least 5 times a week -Do NOT cook with/add to your food: alfredo  sauce, cheese sauce, barbeque sauce, ketchup, fat back, butter, bacon grease, grease, Crisco, OR SUGAR -limit cheese to 2 times a day  Handouts Provided Include   Phase 4  Learning Style & Readiness for Change Teaching method utilized: Visual & Auditory  Demonstrated degree of understanding via: Teach Back  Readiness Level: contemplative  Barriers to learning/adherence to lifestyle change: perceived barriers   RD's Notes for Next Visit . Assess adherence to pt chosen goals   MONITORING & EVALUATION Dietary intake, weekly physical activity, body weight  Next Steps Patient is to follow-up in 3-4 months

## 2020-04-24 ENCOUNTER — Telehealth: Payer: Self-pay | Admitting: Skilled Nursing Facility1

## 2020-04-24 NOTE — Telephone Encounter (Signed)
Response: it is not an absolute no it is just not currently in the dietary phases. Great thinking to prepare for your environment.   Hello. Quick question.  Is popcorn something I can try to eat to see if I tolerate it or is it an absolute no post surgery. I want to go to the movies over spring break and just mentally preparing for it since popcorn is such a part of the experience.  Thank you [name]  Sent from my iPhone

## 2020-06-13 NOTE — Progress Notes (Addendum)
[facility] [address], [place], [place] [address] [phone]  Date:  06/15/2020   Name:  Lisa Patel   DOB:  Jul 29, 1970   MRN:  [id number]  PCP:  Darreld Mclean, MD    Chief Complaint: Urinary Frequency (Dysuria, frequency and urgency, trouble sleeping, pressure, no hematuria/)   History of Present Illness:  Lisa Patel is a 50 y.o. very pleasant female patient who presents with the following:  Patient here today with concern of UTI type symptoms Most recent visit was in January History of controlled diabetes, hyperlipidemia, hypothyroidism, obesity She had gastric bypass surgery in December 2021-continues to lose weight gradually, she is very pleased with her results  Lab Results  Component Value Date   HGBA1C 6.8 (H) 12/27/2019   She has noted sx for about 10 days- pressure, pain with urination She has been able to suppress her sx with cranberry juice but then they return Urinary freqency No hematuria No fever or back pain  No vaginal sx but she is menstruating right now  She has had a UTI in the past and these are typical sx for her  Intercourse tends to trigger a UTI -she would be interested in trying prophylactic antibiotics  She is a middle Education officer, museum, 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  She is looking forward to summer break.  Due to exam she was not able to attend to her UTI as quickly as she might have otherwise  Wt Readings from Last 3 Encounters:  06/15/20 246 lb (111.6 kg)  02/24/20 270 lb 12.8 oz (122.8 kg)  01/20/20 270 lb (122.5 kg)      Patient Active Problem List   Diagnosis Date Noted  . Morbid obesity ([place]) 12/27/2019  . Positive colorectal cancer screening using Cologuard test   . Diverticulosis of colon without hemorrhage   . Grade II hemorrhoids   . Numbness of foot 09/11/2011  . Hypothyroid 09/11/2011  . Diet-controlled diabetes mellitus ([place])  10/02/2009  . Hyperlipidemia associated with type 2 diabetes mellitus ([place]) 10/02/2009  . ANXIETY STATE, UNSPECIFIED 09/04/2009  . PARESTHESIA 09/04/2009  . SNORING 09/04/2009  . VITAMIN D DEFICIENCY 08/31/2008  . HYPERKALEMIA 08/31/2008  . GERD 08/31/2008    Past Medical History:  Diagnosis Date  . Diabetes mellitus   . GERD (gastroesophageal reflux disease)   . Hyperlipidemia   . Hypertension   . Hypothyroidism   . Normal vaginal delivery    02-1993 and 2000 had epidural anesthesia  . Obesity   . Panic attack   . Seasonal allergies    seasonal  . Vitamin D deficiency     Past Surgical History:  Procedure Laterality Date  . COLONOSCOPY WITH PROPOFOL N/A 01/18/2019   Procedure: COLONOSCOPY WITH PROPOFOL;  Surgeon: [name], DO;  Location: [facility];  Service: Gastroenterology;  Laterality: N/A;  . GASTRIC ROUX-EN-Y N/A 12/27/2019   Procedure: LAPAROSCOPIC ROUX-EN-Y GASTRIC BYPASS WITH UPPER ENDOSCOPY;  Surgeon: Kieth Brightly, Arta Bruce, MD;  Location: [facility];  Service: General;  Laterality: N/A;  . UPPER GI ENDOSCOPY N/A 12/27/2019   Procedure: UPPER GI ENDOSCOPY;  Surgeon: Kieth Brightly Arta Bruce, MD;  Location: [facility];  Service: General;  Laterality: N/A;    Social History   Tobacco Use  . Smoking status: Never Smoker  . Smokeless tobacco: Never Used  Vaping Use  . Vaping Use: Never used  Substance Use Topics  . Alcohol use:  Not Currently  . Drug use: Never    Family History  Problem Relation Age of Onset  . Diabetes Maternal Grandmother   . Clotting disorder Maternal Grandmother   . Diabetes Maternal Grandfather   . Diabetes Paternal Grandmother   . Diabetes Paternal Grandfather   . Colon cancer Neg Hx   . Esophageal cancer Neg Hx   . Colon polyps Neg Hx   . Rectal cancer Neg Hx   . Stomach cancer Neg Hx     No Known Allergies  Medication list has been reviewed and updated.  Current Outpatient Medications on File Prior to Visit  Medication  Sig Dispense Refill  . acetaminophen (TYLENOL) 500 MG tablet Take 2,000 mg by mouth daily.    . citalopram (CELEXA) 20 MG tablet TAKE 1 TABLET(20 MG) BY MOUTH DAILY (Patient taking differently: Take 20 mg by mouth daily.) 90 tablet 3  . levothyroxine (SYNTHROID) 100 MCG tablet TAKE 1 TABLET(100 MCG) BY MOUTH DAILY (Patient taking differently: Take 100 mcg by mouth daily.) 90 tablet 3  . montelukast (SINGULAIR) 10 MG tablet TAKE 1 TABLET(10 MG) BY MOUTH AT BEDTIME (Patient taking differently: Take 10 mg by mouth at bedtime.) 90 tablet 3  . omeprazole (PRILOSEC) 20 MG capsule Take 20 mg by mouth daily.    . simvastatin (ZOCOR) 20 MG tablet TAKE 1 TABLET(20 MG) BY MOUTH AT BEDTIME (Patient taking differently: Take 20 mg by mouth at bedtime.) 90 tablet 3  . [DISCONTINUED] gabapentin (NEURONTIN) 100 MG capsule Take 2 capsules (200 mg total) by mouth every 12 (twelve) hours. 20 capsule 0   No current facility-administered medications on file prior to visit.    Review of Systems:  As per HPI- otherwise negative.   Physical Examination: Vitals:   06/15/20 1550  BP: 112/74  Pulse: 88  Resp: 17  Temp: (!) 97.3 F (36.3 C)  SpO2: 99%   Vitals:   06/15/20 1550  Weight: 246 lb (111.6 kg)  Height: 5\' 5"  (1.651 m)   Body mass index is 40.94 kg/m. Ideal Body Weight: Weight in (lb) to have BMI = 25: 149.9  GEN: no acute distress.  Overweight, looks well HEENT: Atraumatic, Normocephalic.  Ears and Nose: No external deformity. CV: RRR, No M/G/R. No JVD. No thrill. No extra heart sounds. PULM: CTA B, no wheezes, crackles, rhonchi. No retractions. No resp. distress. No accessory muscle use. ABD: S, NT, ND, +BS. No rebound. No HSM.  Belly is benign, no CVA tenderness EXTR: No c/c/e PSYCH: Normally interactive. Conversant.  Foot exam- normal  Pt is menstruating today    Assessment and Plan: Dysuria - Plan: Urine Culture, POCT urinalysis dipstick, nitrofurantoin, macrocrystal-monohydrate,  (MACROBID) 100 MG capsule  Frequent UTI   Patient today with dysuria, likely urinary tract infection.  We will have her start on Macrobid at treatment dose, 100 twice daily for 7 days.  Following this, she may take a Macrobid as needed on days that she has intercourse.  She estimates she will need it a few times a month.  I will be in touch with urine culture, she is asked to alert me if not feeling better in the next few days- Sooner if worse.   This visit occurred during the SARS-CoV-2 public health emergency.  Safety protocols were in place, including screening questions prior to the visit, additional usage of staff PPE, and extensive cleaning of exam room while observing appropriate contact time as indicated for disinfecting solutions.    Signed Janett Billow Andreya Lacks,  MD  Addendum 6/5, received her urine culture-pansensitive E. coli Messaged patient Results for orders placed or performed in visit on 06/15/20  Urine Culture   Specimen: Urine  Result Value Ref Range   MICRO NUMBER: [id number]    SPECIMEN QUALITY: Adequate    Sample Source NOT GIVEN    STATUS: FINAL    ISOLATE 1: Escherichia coli (A)       Susceptibility   Escherichia coli - URINE CULTURE, REFLEX    AMOX/CLAVULANIC <=2 Sensitive     AMPICILLIN 4 Sensitive     AMPICILLIN/SULBACTAM <=2 Sensitive     CEFAZOLIN* <=4 Not Reportable      * For infections other than uncomplicated UTIcaused by E. coli, K. pneumoniae or P. mirabilis:Cefazolin is resistant if MIC > or = 8 mcg/mL.(Distinguishing susceptible versus intermediatefor isolates with MIC < or = 4 mcg/mL requiresadditional testing.)For uncomplicated UTI caused by E. coli,K. pneumoniae or P. mirabilis: Cefazolin issusceptible if MIC <32 mcg/mL and predictssusceptible to the oral agents cefaclor, cefdinir,cefpodoxime, cefprozil, cefuroxime, cephalexinand loracarbef.    CEFEPIME <=1 Sensitive     CEFTRIAXONE <=1 Sensitive     CIPROFLOXACIN <=0.25 Sensitive     LEVOFLOXACIN  <=0.12 Sensitive     ERTAPENEM <=0.5 Sensitive     GENTAMICIN <=1 Sensitive     IMIPENEM <=0.25 Sensitive     NITROFURANTOIN <=16 Sensitive     PIP/TAZO <=4 Sensitive     TOBRAMYCIN <=1 Sensitive     TRIMETH/SULFA* <=20 Sensitive      * For infections other than uncomplicated UTIcaused by E. coli, K. pneumoniae or P. mirabilis:Cefazolin is resistant if MIC > or = 8 mcg/mL.(Distinguishing susceptible versus intermediatefor isolates with MIC < or = 4 mcg/mL requiresadditional testing.)For uncomplicated UTI caused by E. coli,K. pneumoniae or P. mirabilis: Cefazolin issusceptible if MIC <32 mcg/mL and predictssusceptible to the oral agents cefaclor, cefdinir,cefpodoxime, cefprozil, cefuroxime, cephalexinand loracarbef.Legend:S = Susceptible  I = IntermediateR = Resistant  NS = Not susceptible* = Not tested  NR = Not reported**NN = See antimicrobic comments  POCT urinalysis dipstick  Result Value Ref Range   Color, UA yellow yellow   Clarity, UA cloudy (A) clear   Glucose, UA negative negative mg/dL   Bilirubin, UA negative negative   Ketones, POC UA negative negative mg/dL   Spec Grav, UA 1.025 1.010 - 1.025   Blood, UA large (A) negative   pH, UA 5.0 5.0 - 8.0   Protein Ur, POC trace (A) negative mg/dL   Urobilinogen, UA 1.0 0.2 or 1.0 E.U./dL   Nitrite, UA Negative Negative   Leukocytes, UA Moderate (2+) (A) Negative

## 2020-06-15 ENCOUNTER — Other Ambulatory Visit: Payer: Self-pay

## 2020-06-15 ENCOUNTER — Encounter: Payer: Self-pay | Admitting: Family Medicine

## 2020-06-15 ENCOUNTER — Ambulatory Visit: Payer: BC Managed Care – PPO | Admitting: Family Medicine

## 2020-06-15 ENCOUNTER — Ambulatory Visit: Payer: BC Managed Care – PPO | Admitting: Skilled Nursing Facility1

## 2020-06-15 VITALS — BP 112/74 | HR 88 | Temp 97.3°F | Resp 17 | Ht 65.0 in | Wt 246.0 lb

## 2020-06-15 DIAGNOSIS — N390 Urinary tract infection, site not specified: Secondary | ICD-10-CM

## 2020-06-15 DIAGNOSIS — R300 Dysuria: Secondary | ICD-10-CM | POA: Diagnosis not present

## 2020-06-15 LAB — POCT URINALYSIS DIP (MANUAL ENTRY)
Bilirubin, UA: NEGATIVE
Glucose, UA: NEGATIVE mg/dL
Ketones, POC UA: NEGATIVE mg/dL
Nitrite, UA: NEGATIVE
Spec Grav, UA: 1.025 (ref 1.010–1.025)
Urobilinogen, UA: 1 E.U./dL
pH, UA: 5 (ref 5.0–8.0)

## 2020-06-15 MED ORDER — NITROFURANTOIN MONOHYD MACRO 100 MG PO CAPS
100.0000 mg | ORAL_CAPSULE | Freq: Two times a day (BID) | ORAL | 0 refills | Status: DC
Start: 2020-06-15 — End: 2021-08-20

## 2020-06-15 NOTE — Patient Instructions (Signed)
I will be in touch with your urine culture asap Use the macrobid twice a day for one week Following this, take one dose on days that you have intercourse to try and prevent UTI  Let me know if not feeling better soon!

## 2020-06-17 LAB — URINE CULTURE
MICRO NUMBER:: 11961263
SPECIMEN QUALITY:: ADEQUATE

## 2020-06-18 ENCOUNTER — Encounter: Payer: Self-pay | Admitting: Family Medicine

## 2020-06-20 ENCOUNTER — Encounter: Payer: BC Managed Care – PPO | Attending: General Surgery | Admitting: Skilled Nursing Facility1

## 2020-06-20 ENCOUNTER — Other Ambulatory Visit: Payer: Self-pay

## 2020-06-20 DIAGNOSIS — E119 Type 2 diabetes mellitus without complications: Secondary | ICD-10-CM | POA: Diagnosis present

## 2020-06-20 NOTE — Progress Notes (Signed)
Bariatric Nutrition Follow-Up Visit Medical Nutrition Therapy   NUTRITION ASSESSMENT   Anthropometrics  Surgery date: 12/27/2019 Surgery type: RYGB Start weight at [facility]: 310.1 Weight today: 244.1   Body Composition Scale 01/11/2020 02/24/2020 06/20/2020  Total Body Fat % 47.8 47 44.6  Visceral Fat 19 18 16   Fat-Free Mass % 52.1 52.9 55.3   Total Body Water % 40.5 40.9 42.1   Muscle-Mass lbs 32.2 32.1 31.8  Body Fat Displacement            Torso  lbs 83.1 79.1 67.5         Left Leg  lbs 16.6 15.8 13.5         Right Leg  lbs 16.6 15.8 13.5         Left Arm  lbs 8.3 7.9 6.7         Right Arm   lbs 8.3 7.9 6.7   Clinical  Medical hx: anxiety, diabetes Medications: taken off lisinopril and metformin  Labs:    Lifestyle & Dietary Hx  Pt states she never has checked her blood sugars.   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 Pt states she is excited to spend the summer in [place]. Pt state she has really been wanting drink or food to calm herself when frustrated. Pt states she is hopeful she will not need to do a second job anymore. Pt states she is working on exploring her wants.   Estimated daily fluid intake: 50 oz Estimated daily protein intake: 70+ g Supplements: multivitamin and 2 calcium  Current average weekly physical activity: ADL's  24-Hr Dietary Recall First Meal: half rice cake + cheese + [place] bacon Snack: almonds  Second Meal: ground chicken burger + cheese or stuffed pepper + cheese  Snack: peanuts or cheese Third Meal:carrots or peppers dipped in low calorie dressing Snack 7pm: chicken + black beans + salsa + cheese Snack: sugar free popcicle  Snack: 1 scoop peanut butter  Beverages: water, water + flavoring, diet cranberry juice, hot tea + splenda + non dairy creamer   Post-Op Goals/ Signs/ Symptoms Using straws: no Drinking while eating: no Chewing/swallowing  difficulties: no Changes in vision: no Changes to mood/headaches: no Hair loss/changes to skin/nails: no Difficulty focusing/concentrating: no Sweating: no Dizziness/lightheadedness: no Palpitations: no  Carbonated/caffeinated beverages: no N/V/D/C/Gas: daily mirilax Abdominal pain: no Dumping syndrome: no  NUTRITION DIAGNOSIS  Overweight/obesity ([place]-3.3) related to past poor dietary habits and physical inactivity as evidenced by completed bariatric surgery and following dietary guidelines for continued weight loss and healthy nutrition status.   NUTRITION INTERVENTION Nutrition counseling (C-1) and education (E-2) to facilitate bariatric surgery goals, including: . Diet advancement to the next phase (phase 5) now including starchy vegetables . The importance of consuming adequate calories as well as certain nutrients daily due to the body's need for essential vitamins, minerals, and fats . The importance of daily physical activity and to reach a goal of at least 150 minutes of moderate to vigorous physical activity weekly (or as directed by their physician) due to benefits such as increased musculature and improved lab values . The importance of intuitive eating specifically learning hunger-satiety cues and understanding the importance of learning a new body: The importance of mindful eating to avoid grazing behaviors  . Avoidance of grazing behaviors/focusing on the behaviors rather than the food being healthy or unhealthy   Goals: -  try at least a 2 minute workout from an app on your phone daily -Continue to aim for a minimum of 64 fluid ounces 7 days a week with at least 30 ounces being plain water -Eat non-starchy vegetables 2 times a day 7 days a week -find little activities to relax  Handouts Provided Include   Phase 5  Should I eat  Benefits of exercise   Learning Style & Readiness for Change Teaching method utilized: Visual & Auditory  Demonstrated degree of  understanding via: Teach Back  Readiness Level: contemplative  Barriers to learning/adherence to lifestyle change: perceived barriers   RD's Notes for Next Visit . Assess adherence to pt chosen goals   MONITORING & EVALUATION Dietary intake, weekly physical activity, body weight  Next Steps Patient is to follow-up in 3-4 months

## 2020-08-18 NOTE — Progress Notes (Addendum)
[facility] [place], [place], [place] [address] [phone]  Date:  08/23/2020   Name:  Lisa Patel   DOB:  Jun 25, 1970   MRN:  [id number]  PCP:  Darreld Mclean, MD    Chief Complaint: Annual Exam (Pap smear/)   History of Present Illness:  Lisa Patel is a 50 y.o. 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  She is overall doing well-getting back to school in the fall is always a challenge  Pneumonia vaccine-we will give today COVID-19 booster- will get today  Needs lab work today Eye exam- this last Monday  Mammogram is up-to-date Colonoscopy 2021  She continues to get periods which can be unpredictable, heavy and painful   Lab Results  Component Value Date   HGBA1C 6.8 (H) 12/27/2019     Wt Readings from Last 3 Encounters:  08/23/20 233 lb (105.7 kg)  06/20/20 244 lb 1.6 oz (110.7 kg)  06/15/20 246 lb (111.6 kg)    Patient Active Problem List   Diagnosis Date Noted   Morbid obesity ([place]) 12/27/2019   Positive colorectal cancer screening using Cologuard test    Diverticulosis of colon without hemorrhage    Grade II hemorrhoids    Numbness of foot 09/11/2011   Hypothyroid 09/11/2011   Diet-controlled diabetes mellitus ([place]) 10/02/2009   Hyperlipidemia associated with type 2 diabetes mellitus ([place]) 10/02/2009   ANXIETY STATE, UNSPECIFIED 09/04/2009    PARESTHESIA 09/04/2009   SNORING 09/04/2009   VITAMIN D DEFICIENCY 08/31/2008   HYPERKALEMIA 08/31/2008   GERD 08/31/2008    Past Medical History:  Diagnosis Date   Diabetes mellitus    GERD (gastroesophageal reflux disease)    Hyperlipidemia    Hypertension    Hypothyroidism    Normal vaginal delivery    02-1993 and 2000 had epidural anesthesia   Obesity    Panic attack    Seasonal allergies    seasonal   Vitamin D deficiency     Past Surgical History:  Procedure Laterality Date   COLONOSCOPY WITH PROPOFOL N/A 01/18/2019   Procedure: COLONOSCOPY WITH PROPOFOL;  Surgeon: [name], DO;  Location: [facility];  Service: Gastroenterology;  Laterality: N/A;   GASTRIC ROUX-EN-Y N/A 12/27/2019   Procedure: LAPAROSCOPIC ROUX-EN-Y GASTRIC BYPASS WITH UPPER ENDOSCOPY;  Surgeon: Kinsinger, Arta Bruce, MD;  Location: [facility];  Service: General;  Laterality: N/A;   UPPER GI ENDOSCOPY N/A 12/27/2019   Procedure: UPPER GI ENDOSCOPY;  Surgeon: Kieth Brightly, Arta Bruce, MD;  Location: [facility];  Service: General;  Laterality: N/A;    Social History   Tobacco Use   Smoking status: Never   Smokeless tobacco: Never  Vaping Use   Vaping Use: Never used  Substance Use Topics   Alcohol use: Not Currently   Drug use: Never    Family History  Problem Relation Age of Onset   Diabetes Maternal Grandmother  Clotting disorder Maternal Grandmother    Diabetes Maternal Grandfather    Diabetes Paternal Grandmother    Diabetes Paternal Grandfather    Colon cancer Neg Hx    Esophageal cancer Neg Hx    Colon polyps Neg Hx    Rectal cancer Neg Hx    Stomach cancer Neg Hx     No Known Allergies  Medication list has been reviewed and updated.  Current Outpatient Medications on File Prior to Visit  Medication Sig Dispense Refill   acetaminophen (TYLENOL) 500 MG tablet Take 2,000 mg by mouth daily.     citalopram (CELEXA) 20 MG tablet TAKE 1 TABLET(20 MG) BY MOUTH DAILY (Patient taking  differently: Take 20 mg by mouth daily.) 90 tablet 3   levothyroxine (SYNTHROID) 100 MCG tablet TAKE 1 TABLET(100 MCG) BY MOUTH DAILY (Patient taking differently: Take 100 mcg by mouth daily.) 90 tablet 3   loratadine (CLARITIN) 10 MG tablet Take 10 mg by mouth daily as needed for allergies.     montelukast (SINGULAIR) 10 MG tablet TAKE 1 TABLET(10 MG) BY MOUTH AT BEDTIME (Patient taking differently: Take 10 mg by mouth at bedtime.) 90 tablet 3   nitrofurantoin, macrocrystal-monohydrate, (MACROBID) 100 MG capsule Take 1 capsule (100 mg total) by mouth 2 (two) times daily. Use for one week then may take one as needed with intercourse 40 capsule 0   omeprazole (PRILOSEC) 20 MG capsule Take 20 mg by mouth daily.     simvastatin (ZOCOR) 20 MG tablet TAKE 1 TABLET(20 MG) BY MOUTH AT BEDTIME (Patient taking differently: Take 20 mg by mouth at bedtime.) 90 tablet 3   [DISCONTINUED] gabapentin (NEURONTIN) 100 MG capsule Take 2 capsules (200 mg total) by mouth every 12 (twelve) hours. 20 capsule 0   No current facility-administered medications on file prior to visit.    Review of Systems:  As per HPI- otherwise negative.   Physical Examination: Vitals:   08/23/20 1021  BP: 122/64  Pulse: 68  Resp: 17  Temp: 97.6 F (36.4 C)  SpO2: 98%   Vitals:   08/23/20 1021  Weight: 233 lb (105.7 kg)  Height: '5\' 5"'$  (1.651 m)   Body mass index is 38.77 kg/m. Ideal Body Weight: Weight in (lb) to have BMI = 25: 149.9  GEN: no acute distress.  Obese but has lost significant weight, looks well HEENT: Atraumatic, Normocephalic. Bilateral TM wnl, oropharynx normal.  PEERL,EOMI.   Ears and Nose: No external deformity. CV: RRR, No M/G/R. No JVD. No thrill. No extra heart sounds. PULM: CTA B, no wheezes, crackles, rhonchi. No retractions. No resp. distress. No accessory muscle use. ABD: S, NT, ND, +BS. No rebound. No HSM. EXTR: No c/c/e PSYCH: Normally interactive. Conversant.  Polyp is present at cervix  -otherwise normal pelvic exam for age  Assessment and Plan: Physical exam  Controlled type 2 diabetes mellitus without complication, without long-term current use of insulin (HCC) - Plan: Comprehensive metabolic panel, Hemoglobin A1c, Microalbumin / creatinine urine ratio  Hypothyroidism (acquired) - Plan: TSH  Gastric bypass status for obesity - Plan: CBC, VITAMIN D 25 Hydroxy (Vit-D Deficiency, Fractures), Vitamin B12, Ferritin  Hyperlipidemia associated with type 2 diabetes mellitus ([place]) - Plan: Lipid panel  Screening for deficiency anemia - Plan: CBC  Encounter for hepatitis C screening test for low risk patient - Plan: Hepatitis C antibody  Screening for cervical cancer - Plan: Cytology - PAP  Immunization due - Plan: Pneumococcal conjugate vaccine 20-valent (Prevnar 20)  Physical exam today.  Encouraged healthy diet and exercise routine Will plan further follow- up pending labs. We discussed continued menstruation.  I offered to have her see GYN for treatment options which might include an IUD or an ablation.  For the time being she declines, but she will let me know if she changes her mind  Plan to visit in 6 months  This visit occurred during the SARS-CoV-2 public health emergency.  Safety protocols were in place, including screening questions prior to the visit, additional usage of staff PPE, and extensive cleaning of exam room while observing appropriate contact time as indicated for disinfecting solutions.   Signed Lamar Blinks, MD  Received labs as below, message to pt  Results for orders placed or performed in visit on 08/23/20  CBC  Result Value Ref Range   WBC 5.3 4.0 - 10.5 K/uL   RBC 4.34 3.87 - 5.11 Mil/uL   Platelets 200.0 150.0 - 400.0 K/uL   Hemoglobin 13.1 12.0 - 15.0 g/dL   HCT 38.9 36.0 - 46.0 %   MCV 89.7 78.0 - 100.0 fl   MCHC 33.5 30.0 - 36.0 g/dL   RDW 13.4 11.5 - 15.5 %  Comprehensive metabolic panel  Result Value Ref Range   Sodium 137  135 - 145 mEq/L   Potassium 4.6 3.5 - 5.1 mEq/L   Chloride 103 96 - 112 mEq/L   CO2 26 19 - 32 mEq/L   Glucose, Bld 92 70 - 99 mg/dL   BUN 14 6 - 23 mg/dL   Creatinine, Ser 0.71 0.40 - 1.20 mg/dL   Total Bilirubin 0.9 0.2 - 1.2 mg/dL   Alkaline Phosphatase 78 39 - 117 U/L   AST 31 0 - 37 U/L   ALT 23 0 - 35 U/L   Total Protein 6.5 6.0 - 8.3 g/dL   Albumin 3.9 3.5 - 5.2 g/dL   GFR 97.95 >60.00 mL/min   Calcium 9.2 8.4 - 10.5 mg/dL  Hemoglobin A1c  Result Value Ref Range   Hgb A1c MFr Bld 5.7 4.6 - 6.5 %  Lipid panel  Result Value Ref Range   Cholesterol 135 0 - 200 mg/dL   Triglycerides 56.0 0.0 - 149.0 mg/dL   HDL 56.30 >39.00 mg/dL   VLDL 11.2 0.0 - 40.0 mg/dL   LDL Cholesterol 67 0 - 99 mg/dL   Total CHOL/HDL Ratio 2    NonHDL 78.26   TSH  Result Value Ref Range   TSH 1.15 0.35 - 5.50 uIU/mL  VITAMIN D 25 Hydroxy (Vit-D Deficiency, Fractures)  Result Value Ref Range   VITD 36.84 30.00 - 100.00 ng/mL  Vitamin B12  Result Value Ref Range   Vitamin B-12 500 211 - 911 pg/mL  Microalbumin / creatinine urine ratio  Result Value Ref Range   Microalb, Ur <0.7 0.0 - 1.9 mg/dL   Creatinine,U 132.6 mg/dL   Microalb Creat Ratio 0.5 0.0 - 30.0 mg/g  Ferritin  Result Value Ref Range   Ferritin 54.0 10.0 - 291.0 ng/mL

## 2020-08-18 NOTE — Patient Instructions (Addendum)
It was good to see again today, I will be in touch with your labs as soon as possible!  You got your pneumonia vaccine today Ok to do 4th dose of covid as well Assuming all is well let's visit in 6 months If you decide you would like possible treatment for irregular and painful periods please let me know- we do have options!

## 2020-08-21 LAB — HM DIABETES EYE EXAM

## 2020-08-22 ENCOUNTER — Encounter: Payer: Self-pay | Admitting: Family Medicine

## 2020-08-23 ENCOUNTER — Other Ambulatory Visit (HOSPITAL_COMMUNITY)
Admission: RE | Admit: 2020-08-23 | Discharge: 2020-08-23 | Disposition: A | Discharge status: Home / Self Care | Payer: BC Managed Care – PPO | Source: Ambulatory Visit | Attending: Family Medicine | Admitting: Family Medicine

## 2020-08-23 ENCOUNTER — Other Ambulatory Visit: Payer: Self-pay

## 2020-08-23 ENCOUNTER — Ambulatory Visit (INDEPENDENT_AMBULATORY_CARE_PROVIDER_SITE_OTHER): Payer: BC Managed Care – PPO | Admitting: Family Medicine

## 2020-08-23 ENCOUNTER — Encounter: Payer: Self-pay | Admitting: Family Medicine

## 2020-08-23 VITALS — BP 122/64 | HR 68 | Temp 97.6°F | Resp 17 | Ht 65.0 in | Wt 233.0 lb

## 2020-08-23 DIAGNOSIS — E119 Type 2 diabetes mellitus without complications: Secondary | ICD-10-CM

## 2020-08-23 DIAGNOSIS — Z124 Encounter for screening for malignant neoplasm of cervix: Secondary | ICD-10-CM | POA: Insufficient documentation

## 2020-08-23 DIAGNOSIS — Z0000 Encounter for general adult medical examination without abnormal findings: Secondary | ICD-10-CM

## 2020-08-23 DIAGNOSIS — E039 Hypothyroidism, unspecified: Secondary | ICD-10-CM | POA: Diagnosis not present

## 2020-08-23 DIAGNOSIS — Z23 Encounter for immunization: Secondary | ICD-10-CM

## 2020-08-23 DIAGNOSIS — Z9884 Bariatric surgery status: Secondary | ICD-10-CM

## 2020-08-23 DIAGNOSIS — Z130 Encounter for screening for diseases of the blood and blood-forming organs and certain disorders involving the immune mechanism: Secondary | ICD-10-CM | POA: Diagnosis not present

## 2020-08-23 DIAGNOSIS — Z1159 Encounter for screening for other viral diseases: Secondary | ICD-10-CM

## 2020-08-23 DIAGNOSIS — E785 Hyperlipidemia, unspecified: Secondary | ICD-10-CM

## 2020-08-23 DIAGNOSIS — E1169 Type 2 diabetes mellitus with other specified complication: Secondary | ICD-10-CM

## 2020-08-23 LAB — LIPID PANEL
Cholesterol: 135 mg/dL (ref 0–200)
HDL: 56.3 mg/dL (ref >39.00)
LDL Cholesterol: 67 mg/dL (ref 0–99)
NonHDL: 78.26
Total CHOL/HDL Ratio: 2
Triglycerides: 56 mg/dL (ref 0.0–149.0)
VLDL: 11.2 mg/dL (ref 0.0–40.0)

## 2020-08-23 LAB — CBC
HCT: 38.9 % (ref 36.0–46.0)
Hemoglobin: 13.1 g/dL (ref 12.0–15.0)
MCHC: 33.5 g/dL (ref 30.0–36.0)
MCV: 89.7 fl (ref 78.0–100.0)
Platelets: 200 10*3/uL (ref 150.0–400.0)
RBC: 4.34 Mil/uL (ref 3.87–5.11)
RDW: 13.4 % (ref 11.5–15.5)
WBC: 5.3 10*3/uL (ref 4.0–10.5)

## 2020-08-23 LAB — COMPREHENSIVE METABOLIC PANEL
ALT: 23 U/L (ref 0–35)
AST: 31 U/L (ref 0–37)
Albumin: 3.9 g/dL (ref 3.5–5.2)
Alkaline Phosphatase: 78 U/L (ref 39–117)
BUN: 14 mg/dL (ref 6–23)
CO2: 26 mEq/L (ref 19–32)
Calcium: 9.2 mg/dL (ref 8.4–10.5)
Chloride: 103 mEq/L (ref 96–112)
Creatinine, Ser: 0.71 mg/dL (ref 0.40–1.20)
GFR: 97.95 mL/min (ref >60.00)
Glucose, Bld: 92 mg/dL (ref 70–99)
Potassium: 4.6 mEq/L (ref 3.5–5.1)
Sodium: 137 mEq/L (ref 135–145)
Total Bilirubin: 0.9 mg/dL (ref 0.2–1.2)
Total Protein: 6.5 g/dL (ref 6.0–8.3)

## 2020-08-23 LAB — MICROALBUMIN / CREATININE URINE RATIO
Creatinine,U: 132.6 mg/dL
Microalb Creat Ratio: 0.5 mg/g (ref 0.0–30.0)
Microalb, Ur: <0.7 mg/dL (ref 0.0–1.9)

## 2020-08-23 LAB — FERRITIN: Ferritin: 54 ng/mL (ref 10.0–291.0)

## 2020-08-23 LAB — VITAMIN B12: Vitamin B-12: 500 pg/mL (ref 211–911)

## 2020-08-23 LAB — HEMOGLOBIN A1C: Hgb A1c MFr Bld: 5.7 % (ref 4.6–6.5)

## 2020-08-23 LAB — VITAMIN D 25 HYDROXY (VIT D DEFICIENCY, FRACTURES): VITD: 36.84 ng/mL (ref 30.00–100.00)

## 2020-08-23 LAB — TSH: TSH: 1.15 u[IU]/mL (ref 0.35–5.50)

## 2020-08-24 ENCOUNTER — Encounter: Payer: Self-pay | Admitting: Family Medicine

## 2020-08-24 LAB — CYTOLOGY - PAP
Comment: NEGATIVE
Diagnosis: NEGATIVE
High risk HPV: NEGATIVE

## 2020-08-24 LAB — HEPATITIS C ANTIBODY
Hepatitis C Ab: NONREACTIVE
SIGNAL TO CUT-OFF: 0.05 (ref <1.00)

## 2020-09-01 IMAGING — DX DG KNEE COMPLETE 4+V*R*
4 series · 4 of 4 positions shown · non-contrast
Comparison: None.

CLINICAL DATA: Status post fall 3 weeks ago with right knee pain.

EXAM:
RIGHT KNEE - COMPLETE 4+ VIEW

[knee ap]
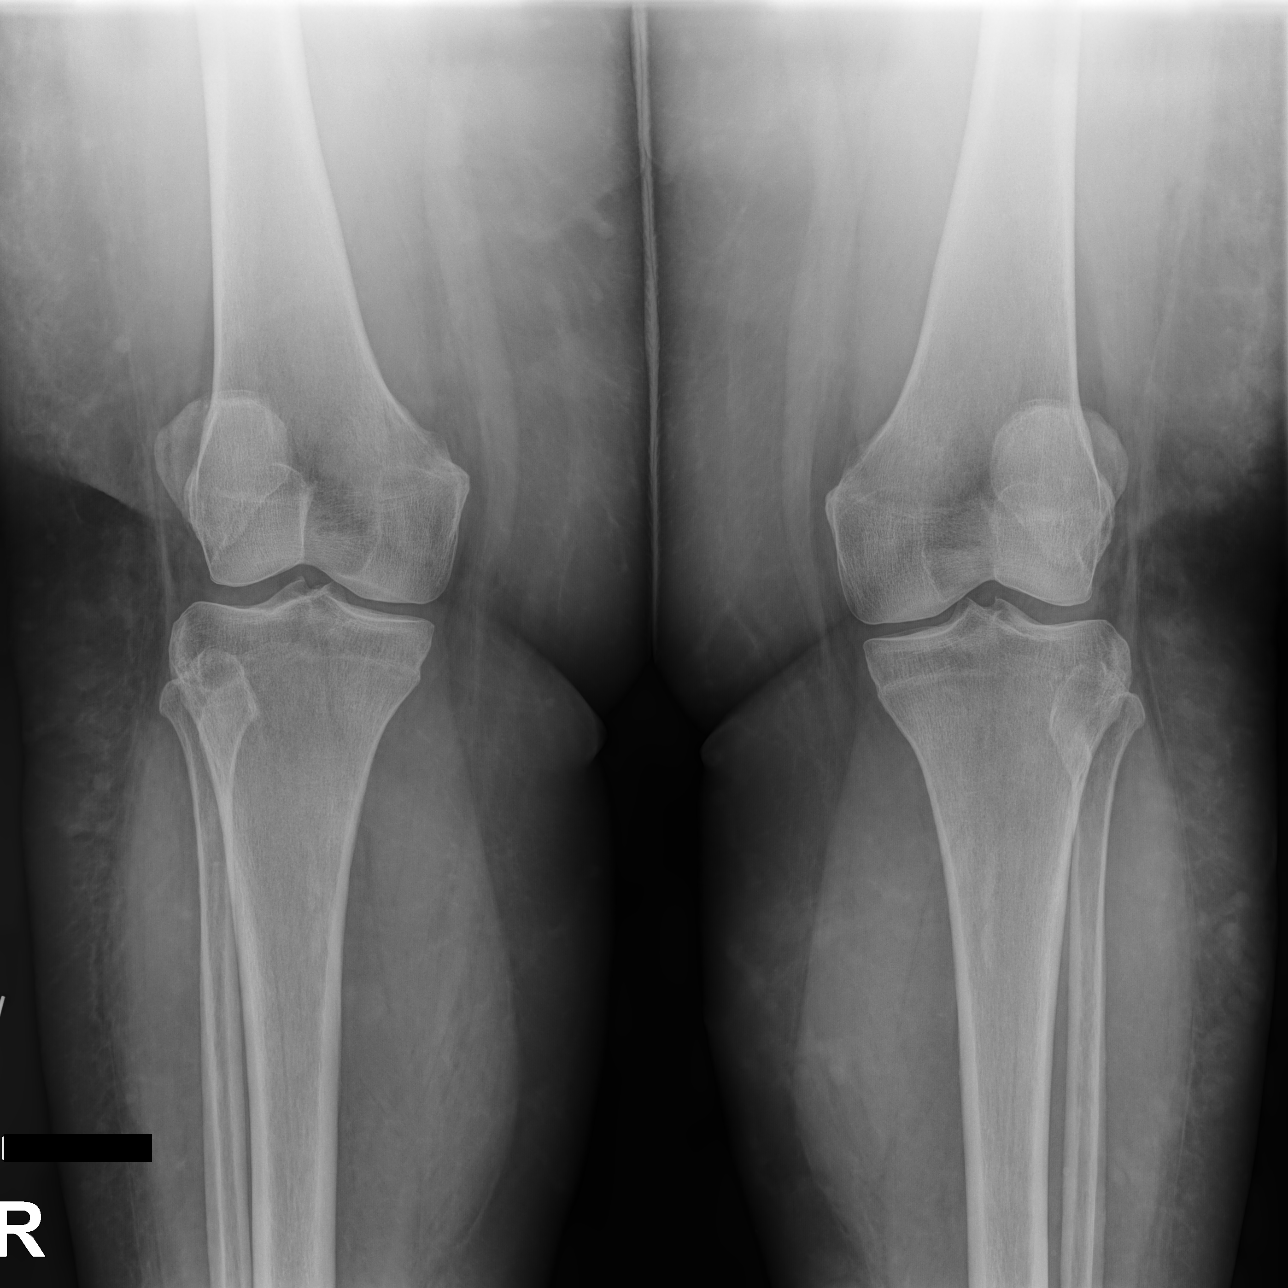

[knee [person_name] view pa]
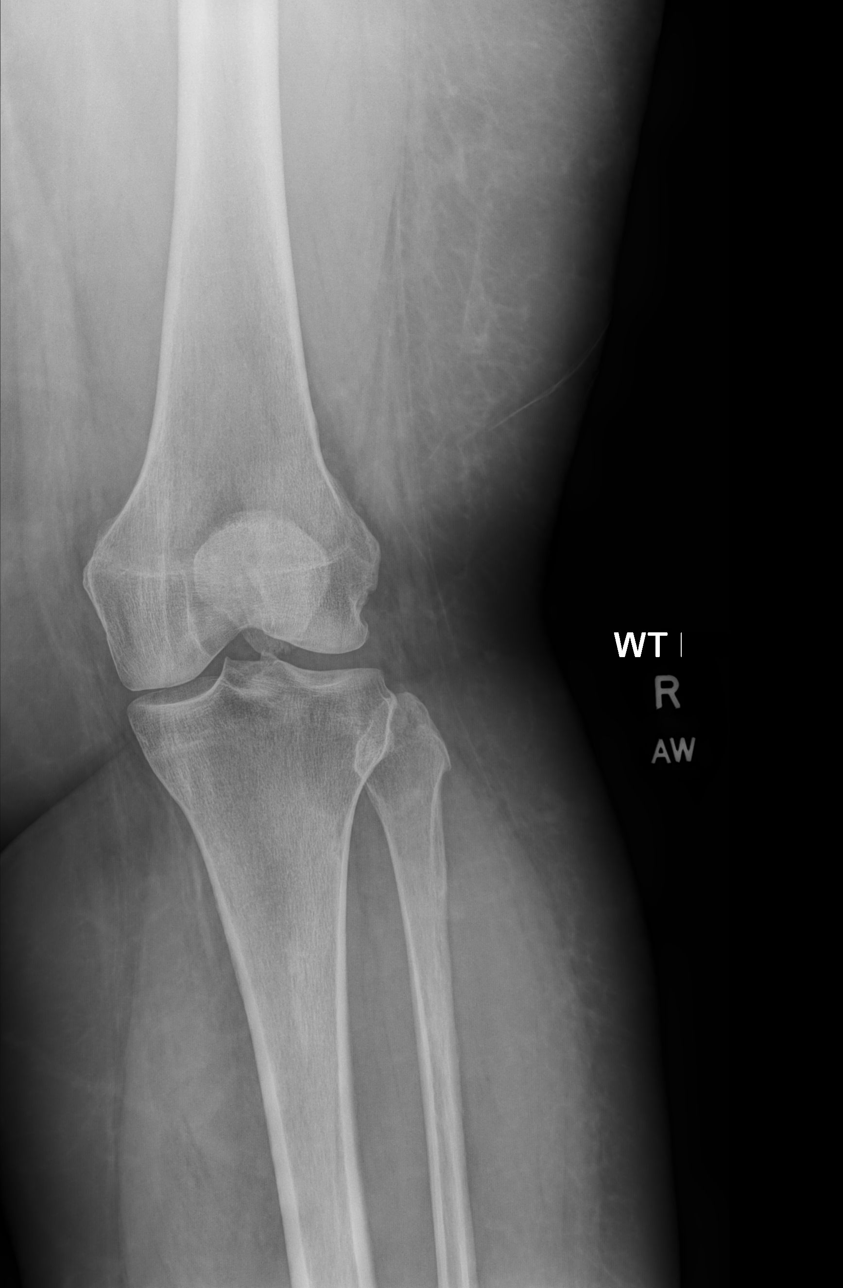

[knee lat]
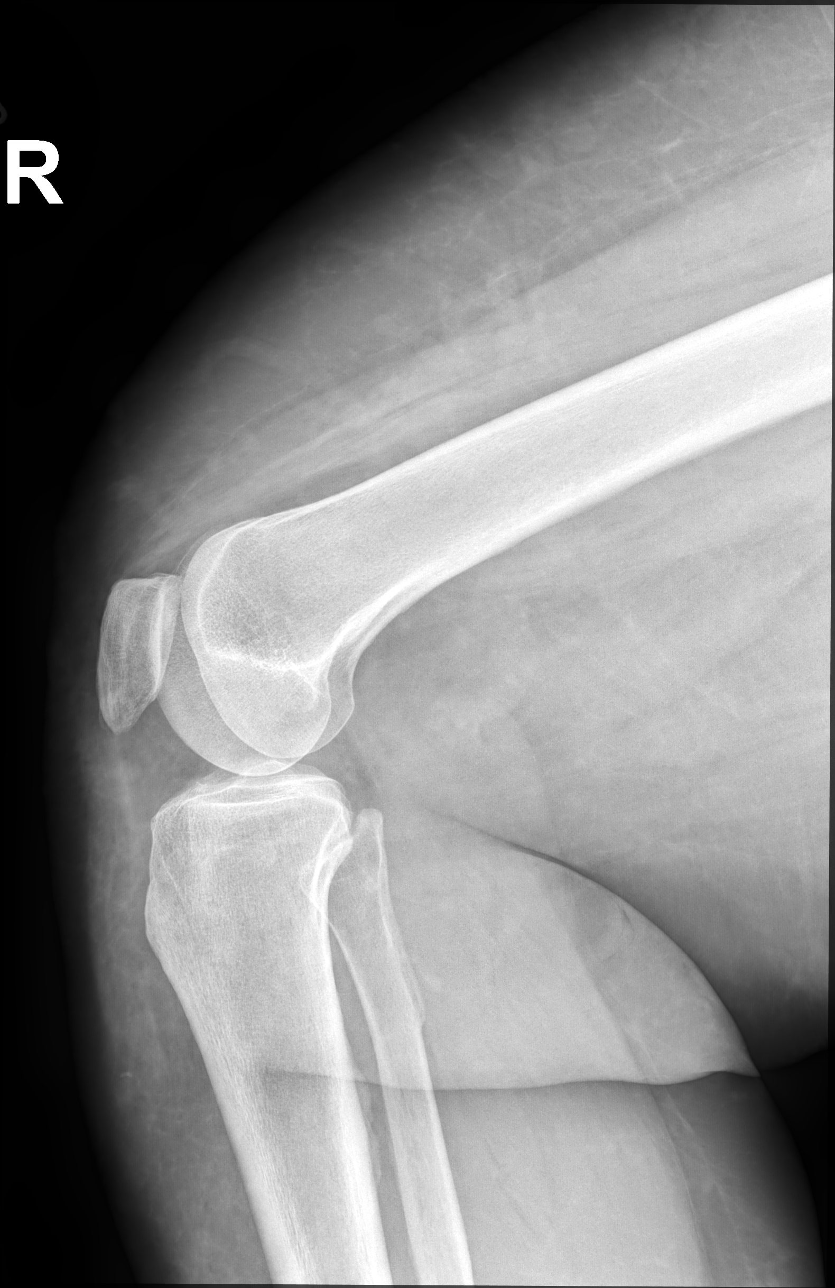

[patella (sunrise)]
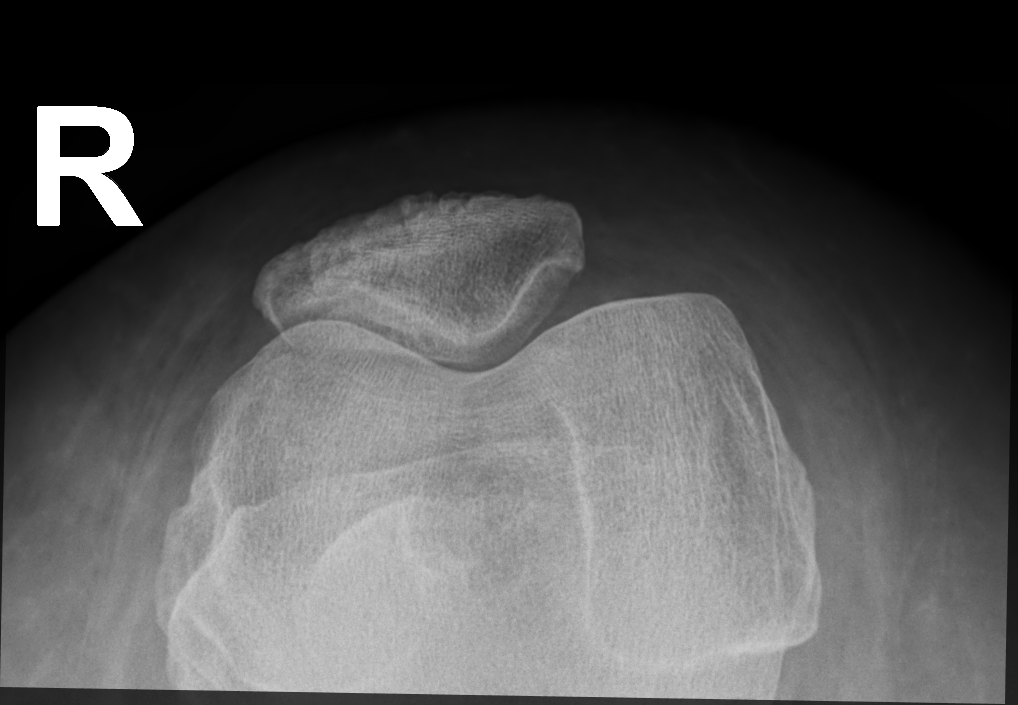

[4 of 4 positions shown; findings below may reference images not displayed]

FINDINGS: No evidence of fracture, dislocation, or joint effusion. Decreased
bilateral medial femoral tibial joint space are noted. Soft tissues
are unremarkable.
IMPRESSION: No acute fracture or dislocation. Mild degenerative joint changes of
bilateral knees.

## 2020-09-20 ENCOUNTER — Encounter: Payer: BC Managed Care – PPO | Admitting: Skilled Nursing Facility1

## 2020-09-24 ENCOUNTER — Encounter: Payer: Self-pay | Admitting: Family Medicine

## 2020-09-26 ENCOUNTER — Other Ambulatory Visit: Payer: Self-pay | Admitting: Family Medicine

## 2020-09-26 DIAGNOSIS — E785 Hyperlipidemia, unspecified: Secondary | ICD-10-CM

## 2020-09-26 DIAGNOSIS — E1169 Type 2 diabetes mellitus with other specified complication: Secondary | ICD-10-CM

## 2020-09-26 DIAGNOSIS — F411 Generalized anxiety disorder: Secondary | ICD-10-CM

## 2020-09-26 DIAGNOSIS — R062 Wheezing: Secondary | ICD-10-CM

## 2020-09-26 DIAGNOSIS — E039 Hypothyroidism, unspecified: Secondary | ICD-10-CM

## 2020-10-18 ENCOUNTER — Encounter: Payer: BC Managed Care – PPO | Attending: General Surgery | Admitting: Skilled Nursing Facility1

## 2020-10-18 ENCOUNTER — Other Ambulatory Visit: Payer: Self-pay

## 2020-10-18 DIAGNOSIS — E119 Type 2 diabetes mellitus without complications: Secondary | ICD-10-CM | POA: Insufficient documentation

## 2020-10-18 NOTE — Progress Notes (Signed)
Bariatric Nutrition Follow-Up Visit Medical Nutrition Therapy   NUTRITION ASSESSMENT   Anthropometrics  Surgery date: 12/27/2019 Surgery type: RYGB Start weight at [facility]: 310.1 Weight today: 227.5 pounds   Body Composition Scale 01/11/2020 02/24/2020 06/20/2020 10/18/2020  Total Body Fat % 47.8 47 44.6 42.9  Visceral Fat 19 18 16 14   Fat-Free Mass % 52.1 52.9 55.3 57   Total Body Water % 40.5 40.9 42.1 43   Muscle-Mass lbs 32.2 32.1 31.8 31.5  Body Fat Displacement             Torso  lbs 83.1 79.1 67.5 60.5         Left Leg  lbs 16.6 15.8 13.5 12.1         Right Leg  lbs 16.6 15.8 13.5 12.1         Left Arm  lbs 8.3 7.9 6.7 6.0         Right Arm   lbs 8.3 7.9 6.7 6.0   Clinical  Medical hx: anxiety, diabetes Medications: taken off lisinopril and metformin  Labs:    Lifestyle & Dietary Hx  Pt states she never has checked her blood sugars.   Pt states she feels she has done very well with making changes and sticking with them actively working on emotional eating.    Estimated daily fluid intake: 50 oz Estimated daily protein intake: 70+ g Supplements: multivitamin and 2 calcium  Current average weekly physical activity: ADL's; trying to take the stairs more often  24-Hr Dietary Recall First Meal: whole rice cake + cheese + [place] bacon Snack: almonds or greek yogurt or cheese Second Meal: chicken + salsa + corn + beans Snack: peanuts or cheese Third Meal:carrots or peppers dipped in low calorie dressing Snack 7pm: chicken + black beans + salsa + cheese Snack: fruit Snack: popcorn or in the shell peanuts  Beverages: water, water + flavoring, diet cranberry juice, hot tea + splenda + non dairy creamer   Post-Op Goals/ Signs/ Symptoms Using straws: no Drinking while eating: no Chewing/swallowing difficulties: no Changes in vision: no Changes to mood/headaches: no Hair loss/changes to skin/nails: no Difficulty focusing/concentrating: no Sweating:  no Dizziness/lightheadedness: no Palpitations: no  Carbonated/caffeinated beverages: no N/V/D/C/Gas: daily mirilax Abdominal pain: no Dumping syndrome: no  NUTRITION DIAGNOSIS  Overweight/obesity ([place]-3.3) related to past poor dietary habits and physical inactivity as evidenced by completed bariatric surgery and following dietary guidelines for continued weight loss and healthy nutrition status.   NUTRITION INTERVENTION Nutrition counseling (C-1) and education (E-2) to facilitate bariatric surgery goals, including: The importance of consuming adequate calories as well as certain nutrients daily due to the body's need for essential vitamins, minerals, and fats The importance of daily physical activity and to reach a goal of at least 150 minutes of moderate to vigorous physical activity weekly (or as directed by their physician) due to benefits such as increased musculature and improved lab values The importance of intuitive eating specifically learning hunger-satiety cues and understanding the importance of learning a new body: The importance of mindful eating to avoid grazing behaviors  Avoidance of grazing behaviors/focusing on the behaviors rather than the food being healthy or unhealthy  Encouraged patient to honor their body's internal hunger and fullness cues.  Throughout the day, check in mentally and rate hunger. Stop eating when satisfied not full regardless of how much food is left on the plate.  Get more if still hungry 20-30 minutes later.  The key is to honor satisfaction so  throughout the meal, rate fullness factor and stop when comfortably satisfied not physically full. The key is to honor hunger and fullness without any feelings of guilt or shame.  Pay attention to what the internal cues are, rather than any external factors. This will enhance the confidence you have in listening to your own body and following those internal cues enabling you to increase how often you eat when you are  hungry not out of appetite and stop when you are satisfied not full.  Encouraged pt to continue to eat balanced meals inclusive of non starchy vegetables 2 times a day 7 days a week Encouraged pt to choose lean protein sources: limiting beef, pork, sausage, hotdogs, and lunch meat Encourage pt to choose healthy fats such as plant based limiting animal fats Encouraged pt to continue to drink a minium 64 fluid ounces with half being plain water to satisfy proper hydration   Goals: -aim for 150 minutes per week of physical activity getting your heart rate up  Handouts Provided Include  Maintenance folder  Learning Style & Readiness for Change Teaching method utilized: Visual & Auditory  Demonstrated degree of understanding via: Teach Back  Readiness Level: contemplative  Barriers to learning/adherence to lifestyle change: perceived barriers   RD's Notes for Next Visit Assess adherence to pt chosen goals   MONITORING & EVALUATION Dietary intake, weekly physical activity, body weight  Next Steps Patient is to follow-up as needed: pt states she believes she has it from here and will call if needed

## 2020-11-13 ENCOUNTER — Ambulatory Visit: Payer: BC Managed Care – PPO | Admitting: Skilled Nursing Facility1

## 2021-02-26 ENCOUNTER — Ambulatory Visit: Payer: BC Managed Care – PPO | Admitting: Family Medicine

## 2021-04-18 ENCOUNTER — Telehealth: Payer: BC Managed Care – PPO | Admitting: Physician Assistant

## 2021-04-18 DIAGNOSIS — H00015 Hordeolum externum left lower eyelid: Secondary | ICD-10-CM | POA: Diagnosis not present

## 2021-04-18 MED ORDER — ERYTHROMYCIN 5 MG/GM OP OINT: 1.0000 "application " | TOPICAL_OINTMENT | Freq: Two times a day (BID) | OPHTHALMIC | 0 refills | Status: DC

## 2021-04-18 NOTE — Progress Notes (Signed)
?Virtual Visit Consent  ? ?Lisa Patel, you are scheduled for a virtual visit with a [facility] provider today.   ?  ?Just as with appointments in the office, your consent must be obtained to participate.  Your consent will be active for this visit and any virtual visit you may have with one of our providers in the next 365 days.   ?  ?If you have a MyChart account, a copy of this consent can be sent to you electronically.  All virtual visits are billed to your insurance company just like a traditional visit in the office.   ? ?As this is a virtual visit, video technology does not allow for your provider to perform a traditional examination.  This may limit your provider's ability to fully assess your condition.  If your provider identifies any concerns that need to be evaluated in person or the need to arrange testing (such as labs, EKG, etc.), we will make arrangements to do so.   ?  ?Although advances in technology are sophisticated, we cannot ensure that it will always work on either your end or our end.  If the connection with a video visit is poor, the visit may have to be switched to a telephone visit.  With either a video or telephone visit, we are not always able to ensure that we have a secure connection.    ? ?I need to obtain your verbal consent now.   Are you willing to proceed with your visit today?  ?  ?Lisa Patel has provided verbal consent on 04/18/2021 for a virtual visit (video or telephone). ?  ?Mar Daring, PA-C  ? ?Date: 04/18/2021 6:07 PM ? ? ?Virtual Visit via Video Note  ? ?Lisa Patel, connected with  Lisa Patel  ([id number], Jan 21, 1970) on 04/18/21 at  6:00 PM EDT by a video-enabled telemedicine application and verified that I am speaking with the correct person using two identifiers. ? ?Location: ?Patient: Virtual Visit Location Patient: Home ?Provider: Virtual Visit Location Provider: Home Office ?  ?I discussed the limitations of evaluation and management by  telemedicine and the availability of in person appointments. The patient expressed understanding and agreed to proceed.   ? ?History of Present Illness: ?Lisa Patel is a 51 y.o. who identifies as a female who was assigned female at birth, and is being seen today for stye. ? ?HPI: Eye Problem  ?The left eye is affected. This is a new problem. The current episode started in the past 7 days (Had stye of left upper eye and used compresses and that improved, now has moved into the left lower lid and has been worsening over the last 4 days). The problem occurs constantly. The problem has been gradually worsening. There was no injury mechanism. The pain is mild. There is No known exposure to pink eye. She Does not wear contacts. Associated symptoms include an eye discharge, eye redness and a foreign body sensation. Pertinent negatives include no double vision, fever, nausea or photophobia. Treatments tried: compresses. The treatment provided no relief.   ? ? ?Problems:  ?Patient Active Problem List  ? Diagnosis Date Noted  ? Morbid obesity ([place]) 12/27/2019  ? Positive colorectal cancer screening using Cologuard test   ? Diverticulosis of colon without hemorrhage   ? Grade II hemorrhoids   ? Numbness of foot 09/11/2011  ? Hypothyroid 09/11/2011  ? Diet-controlled diabetes mellitus ([place]) 10/02/2009  ? Hyperlipidemia associated with type 2 diabetes mellitus ([place]) 10/02/2009  ?  ANXIETY STATE, UNSPECIFIED 09/04/2009  ? PARESTHESIA 09/04/2009  ? SNORING 09/04/2009  ? VITAMIN D DEFICIENCY 08/31/2008  ? HYPERKALEMIA 08/31/2008  ? GERD 08/31/2008  ?  ?Allergies: No Known Allergies ?Medications:  ?Current Outpatient Medications:  ?  erythromycin ophthalmic ointment, Place 1 application. into the left eye in the morning and at bedtime., Disp: 14 g, Rfl: 0 ?  acetaminophen (TYLENOL) 500 MG tablet, Take 2,000 mg by mouth daily., Disp: , Rfl:  ?  citalopram (CELEXA) 20 MG tablet, TAKE 1 TABLET(20 MG) BY MOUTH DAILY, Disp: 90  tablet, Rfl: 3 ?  levothyroxine (SYNTHROID) 100 MCG tablet, TAKE 1 TABLET(100 MCG) BY MOUTH DAILY, Disp: 90 tablet, Rfl: 3 ?  loratadine (CLARITIN) 10 MG tablet, Take 10 mg by mouth daily as needed for allergies., Disp: , Rfl:  ?  montelukast (SINGULAIR) 10 MG tablet, TAKE 1 TABLET(10 MG) BY MOUTH AT BEDTIME, Disp: 90 tablet, Rfl: 3 ?  nitrofurantoin, macrocrystal-monohydrate, (MACROBID) 100 MG capsule, Take 1 capsule (100 mg total) by mouth 2 (two) times daily. Use for one week then may take one as needed with intercourse, Disp: 40 capsule, Rfl: 0 ?  omeprazole (PRILOSEC) 20 MG capsule, Take 20 mg by mouth daily., Disp: , Rfl:  ?  simvastatin (ZOCOR) 20 MG tablet, TAKE 1 TABLET(20 MG) BY MOUTH AT BEDTIME, Disp: 90 tablet, Rfl: 3 ? ?Observations/Objective: ?Patient is well-developed, well-nourished in no acute distress.  ?Resting comfortably at home.  ?Head is normocephalic, atraumatic.  ?No labored breathing.  ?Speech is clear and coherent with logical content.  ?Patient is alert and oriented at baseline.  ?Left lower outer lid is swollen, mild redness over swelling ? ?Assessment and Plan: ?1. Hordeolum externum of left lower eyelid ?- erythromycin ophthalmic ointment; Place 1 application. into the left eye in the morning and at bedtime.  Dispense: 14 g; Refill: 0 ? ?- Internal hordeolum noted ?- Erythromycin ointment prescribed ?- Continue warm compresses ?- Seek in person follow up if symptoms worsen or fail to improve ? ?Follow Up Instructions: ?I discussed the assessment and treatment plan with the patient. The patient was provided an opportunity to ask questions and all were answered. The patient agreed with the plan and demonstrated an understanding of the instructions.  A copy of instructions were sent to the patient via MyChart unless otherwise noted below.  ? ? ?The patient was advised to call back or seek an in-person evaluation if the symptoms worsen or if the condition fails to improve as  anticipated. ? ?Time:  ?I spent 8 minutes with the patient via telehealth technology discussing the above problems/concerns.   ? ?Mar Daring, PA-C ?

## 2021-04-18 NOTE — Patient Instructions (Signed)
?Renaldo Harrison, thank you for joining Mar Daring, PA-C for today's virtual visit.  While this provider is not your primary care provider (PCP), if your PCP is located in our provider database this encounter information will be shared with them immediately following your visit. ? ?Consent: ?(Patient) Lisa Patel provided verbal consent for this virtual visit at the beginning of the encounter. ? ?Current Medications: ? ?Current Outpatient Medications:  ?  erythromycin ophthalmic ointment, Place 1 application. into the left eye in the morning and at bedtime., Disp: 14 g, Rfl: 0 ?  acetaminophen (TYLENOL) 500 MG tablet, Take 2,000 mg by mouth daily., Disp: , Rfl:  ?  citalopram (CELEXA) 20 MG tablet, TAKE 1 TABLET(20 MG) BY MOUTH DAILY, Disp: 90 tablet, Rfl: 3 ?  levothyroxine (SYNTHROID) 100 MCG tablet, TAKE 1 TABLET(100 MCG) BY MOUTH DAILY, Disp: 90 tablet, Rfl: 3 ?  loratadine (CLARITIN) 10 MG tablet, Take 10 mg by mouth daily as needed for allergies., Disp: , Rfl:  ?  montelukast (SINGULAIR) 10 MG tablet, TAKE 1 TABLET(10 MG) BY MOUTH AT BEDTIME, Disp: 90 tablet, Rfl: 3 ?  nitrofurantoin, macrocrystal-monohydrate, (MACROBID) 100 MG capsule, Take 1 capsule (100 mg total) by mouth 2 (two) times daily. Use for one week then may take one as needed with intercourse, Disp: 40 capsule, Rfl: 0 ?  omeprazole (PRILOSEC) 20 MG capsule, Take 20 mg by mouth daily., Disp: , Rfl:  ?  simvastatin (ZOCOR) 20 MG tablet, TAKE 1 TABLET(20 MG) BY MOUTH AT BEDTIME, Disp: 90 tablet, Rfl: 3  ? ?Medications ordered in this encounter:  ?Meds ordered this encounter  ?Medications  ? erythromycin ophthalmic ointment  ?  Sig: Place 1 application. into the left eye in the morning and at bedtime.  ?  Dispense:  14 g  ?  Refill:  0  ?  Order Specific Question:   Supervising Provider  ?  Answer:   Noemi Chapel [[id number]]  ?  ? ?*If you need refills on other medications prior to your next appointment, please contact your  pharmacy* ? ?Follow-Up: ?Call back or seek an in-person evaluation if the symptoms worsen or if the condition fails to improve as anticipated. ? ?Other Instructions ? ?Stye ?A stye, also known as a hordeolum, is a bump that forms on an eyelid. It may look like a pimple next to the eyelash. A stye can form inside the eyelid (internal stye) or outside the eyelid (external stye). A stye can cause redness, swelling, and pain on the eyelid. ?Styes are very common. Anyone can get them at any age. They usually occur in just one eye at a time, but you may have more than one in either eye. ?What are the causes? ?A stye is caused by an infection. The infection is almost always caused by bacteria called Staphylococcus aureus. This is a common type of bacteria that lives on the skin. ?An internal stye may result from an infected oil-producing gland inside the eyelid. An external stye may be caused by an infection at the base of the eyelash (hair follicle). ?What increases the risk? ?You are more likely to develop a stye if: ?You have had a stye before. ?You have any of these conditions: ?Red, itchy, inflamed eyelids (blepharitis). ?A skin condition such as seborrheic dermatitis or rosacea. ?High fat levels in your blood (lipids). ?Dry eyes. ?What are the signs or symptoms? ?The most common symptom of a stye is eyelid pain. Internal styes are more painful than external styes. Other  symptoms may include: ?Painful swelling of your eyelid. ?A scratchy feeling in your eye. ?Tearing and redness of your eye. ?A pimple-like bump on the edge of the eyelid. ?Pus draining from the stye. ?How is this diagnosed? ?Your health care provider may be able to diagnose a stye just by examining your eye. The health care provider may also check to make sure: ?You do not have a fever or other signs of a more serious infection. ?The infection has not spread to other parts of your eye or areas around your eye. ?How is this treated? ?Most styes will  clear up in a few days without treatment or with warm compresses applied to the area. You may need to use antibiotic drops or ointment to treat an infection. Sometimes, steroid drops or ointment are used in addition to antibiotics. ?In some cases, your health care provider may give you a small steroid injection in the eyelid. ?If your stye does not heal with routine treatment, your health care provider may drain pus from the stye using a thin blade or needle. This may be done if the stye is large, causing a lot of pain, or affecting your vision. ?Follow these instructions at home: ?Take over-the-counter and prescription medicines only as told by your health care provider. This includes eye drops or ointments. ?If you were prescribed an antibiotic medicine, steroid medicine, or both, apply or use them as told by your health care provider. Do not stop using the medicine even if your condition improves. ?Apply a warm, wet cloth (warm compress) to your eye for 5-10 minutes, 4 to 6 times a day. ?Clean the affected eyelid as directed by your health care provider. ?Do not wear contact lenses or eye makeup until your stye has healed and your health care provider says that it is safe. ?Do not try to pop or drain the stye. ?Do not rub your eye. ?Contact a health care provider if: ?You have chills or a fever. ?Your stye does not go away after several days. ?Your stye affects your vision. ?Your eyeball becomes swollen, red, or painful. ?Get help right away if: ?You have pain when moving your eye around. ?Summary ?A stye is a bump that forms on an eyelid. It may look like a pimple next to the eyelash. ?A stye can form inside the eyelid (internal stye) or outside the eyelid (external stye). A stye can cause redness, swelling, and pain on the eyelid. ?Your health care provider may be able to diagnose a stye just by examining your eye. ?Apply a warm, wet cloth (warm compress) to your eye for 5-10 minutes, 4 to 6 times a day. ?This  information is not intended to replace advice given to you by your health care provider. Make sure you discuss any questions you have with your health care provider. ?Document Revised: 03/08/2020 Document Reviewed: 03/08/2020 ?Elsevier Patient Education ? 2022 [place]. ? ? ? ?If you have been instructed to have an in-person evaluation today at a local Urgent Care facility, please use the link below. It will take you to a list of all of our available [facility], including address, phone number and hours of operation. Please do not delay care.  ?[facility] ? ?If you or a family member do not have a primary care provider, use the link below to schedule a visit and establish care. When you choose a [facility] primary care physician or advanced practice provider, you gain a long-term partner in  health. ?Find a Primary Care Provider ? ?Learn more about [facility]'s in-office and virtual care options: ?[facility] ?

## 2021-06-28 ENCOUNTER — Telehealth: Payer: Self-pay | Admitting: Family Medicine

## 2021-06-28 ENCOUNTER — Other Ambulatory Visit: Payer: Self-pay

## 2021-06-28 DIAGNOSIS — E1169 Type 2 diabetes mellitus with other specified complication: Secondary | ICD-10-CM

## 2021-06-28 DIAGNOSIS — R062 Wheezing: Secondary | ICD-10-CM

## 2021-06-28 MED ORDER — MONTELUKAST SODIUM 10 MG PO TABS: ORAL_TABLET | ORAL | 0 refills | Status: DC

## 2021-06-28 MED ORDER — SIMVASTATIN 20 MG PO TABS: ORAL_TABLET | ORAL | 0 refills | Status: DC

## 2021-06-28 NOTE — Telephone Encounter (Signed)
Meds refilled today. Reminder sent to the pt to schedule an appointment.

## 2021-06-28 NOTE — Telephone Encounter (Signed)
Medication: simvastatin (ZOCOR) 20 MG tablet  montelukast (SINGULAIR) 10 MG tablet   Has the patient contacted their pharmacy? Yes.    Preferred Pharmacy (with phone number or street name): [facility] [address] [place], [place] [address] Phone: [phone]  Rollene Fare with [facility] called on behalf of pt tele- [phone]

## 2021-07-16 ENCOUNTER — Encounter: Payer: Self-pay | Admitting: Family Medicine

## 2021-07-17 MED ORDER — CEPHALEXIN 500 MG PO CAPS: 500.0000 mg | ORAL_CAPSULE | Freq: Two times a day (BID) | ORAL | 0 refills | Status: DC

## 2021-07-20 ENCOUNTER — Encounter (HOSPITAL_COMMUNITY): Payer: Self-pay | Admitting: *Deleted

## 2021-08-07 ENCOUNTER — Other Ambulatory Visit: Payer: Self-pay | Admitting: Family Medicine

## 2021-08-07 DIAGNOSIS — E039 Hypothyroidism, unspecified: Secondary | ICD-10-CM

## 2021-08-14 NOTE — Patient Instructions (Addendum)
It was great to see you again today, I will be in touch with your labs Let me know if you would like to do a coronary calcium score at some point in the next year or so  Keep up the good work!

## 2021-08-14 NOTE — Progress Notes (Signed)
[facility] [place], [place], [place] [address] [phone]  Date:  08/20/2021   Name:  Lisa Patel   DOB:  1970/08/21   MRN:  [id number]  PCP:  Darreld Mclean, MD    Chief Complaint: Annual Exam (Concerns/ questions: Karie Mainland, Foot exam due)   History of Present Illness:  Lisa Patel is a 51 y.o. 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    Urine microalbumin and labs are due Mammogram 1 year ago- scheduled next week  Colonoscopy done 2021 following positive Cologuard, benign  Pap one year ago, negative   Her weight is steady She does try to exercise as much as she can but does not have a formal program - she is much more active than prior to her surgery ,she has a lot more energy  No CP or SOB We talked about coronary calcium today   Wt Readings from Last 3 Encounters:  08/20/21 229 lb 9.6 oz (104.1 kg)  10/18/20 227 lb 8 oz (103.2 kg)  08/23/20 233 lb (105.7 kg)    Patient Active Problem List   Diagnosis Date Noted   Morbid obesity ([place]) 12/27/2019   Positive colorectal cancer screening using Cologuard test    Diverticulosis of colon without hemorrhage    Grade II hemorrhoids    Numbness of foot 09/11/2011   Hypothyroid 09/11/2011   Diet-controlled diabetes mellitus ([place]) 10/02/2009   Hyperlipidemia associated with type 2 diabetes mellitus ([place]) 10/02/2009   ANXIETY STATE, UNSPECIFIED 09/04/2009   PARESTHESIA 09/04/2009   SNORING 09/04/2009   VITAMIN D DEFICIENCY 08/31/2008   HYPERKALEMIA 08/31/2008   GERD 08/31/2008    Past Medical  History:  Diagnosis Date   Diabetes mellitus    GERD (gastroesophageal reflux disease)    Hyperlipidemia    Hypertension    Hypothyroidism    Normal vaginal delivery    02-1993 and 2000 had epidural anesthesia   Obesity    Panic attack    Seasonal allergies    seasonal   Vitamin D deficiency     Past Surgical History:  Procedure Laterality Date   COLONOSCOPY WITH PROPOFOL N/A 01/18/2019   Procedure: COLONOSCOPY WITH PROPOFOL;  Surgeon: [name], DO;  Location: [facility];  Service: Gastroenterology;  Laterality: N/A;   GASTRIC ROUX-EN-Y N/A 12/27/2019   Procedure: LAPAROSCOPIC ROUX-EN-Y GASTRIC BYPASS WITH UPPER ENDOSCOPY;  Surgeon: Kinsinger, Arta Bruce, MD;  Location: [facility];  Service: General;  Laterality: N/A;   UPPER GI ENDOSCOPY N/A 12/27/2019   Procedure: UPPER GI ENDOSCOPY;  Surgeon: Kieth Brightly, Arta Bruce, MD;  Location: [facility];  Service: General;  Laterality: N/A;    Social History   Tobacco Use   Smoking status: Never   Smokeless tobacco: Never  Vaping Use   Vaping Use: Never used  Substance Use Topics   Alcohol use: Not Currently   Drug use: Never    Family History  Problem Relation Age of Onset   Diabetes Maternal Grandmother    Clotting disorder Maternal Grandmother    Diabetes Maternal Grandfather    Diabetes Paternal Grandmother    Diabetes Paternal  Grandfather    Colon cancer Neg Hx    Esophageal cancer Neg Hx    Colon polyps Neg Hx    Rectal cancer Neg Hx    Stomach cancer Neg Hx     No Known Allergies  Medication list has been reviewed and updated.  Current Outpatient Medications on File Prior to Visit  Medication Sig Dispense Refill   acetaminophen (TYLENOL) 500 MG tablet Take 2,000 mg by mouth daily.     citalopram (CELEXA) 20 MG tablet TAKE 1 TABLET(20 MG) BY MOUTH DAILY 90 tablet 3   erythromycin ophthalmic ointment Place 1 application. into the left eye in the morning and at bedtime. 14 g 0   levothyroxine (SYNTHROID) 100  MCG tablet Take 1 tablet by mouth daily. 90 tablet 3   montelukast (SINGULAIR) 10 MG tablet Take 1 tablet by mouth at bedtime. 90 tablet 0   omeprazole (PRILOSEC) 20 MG capsule Take 20 mg by mouth daily.     simvastatin (ZOCOR) 20 MG tablet Take 1 Tablet by mouth at bedtime. 90 tablet 0   [DISCONTINUED] gabapentin (NEURONTIN) 100 MG capsule Take 2 capsules (200 mg total) by mouth every 12 (twelve) hours. 20 capsule 0   No current facility-administered medications on file prior to visit.    Review of Systems:  As per HPI- otherwise negative.   Physical Examination: Vitals:   08/20/21 1029  BP: 112/64  Pulse: (!) 55  Resp: 18  Temp: 97.9 F (36.6 C)  SpO2: 99%   Vitals:   08/20/21 1029  Weight: 229 lb 9.6 oz (104.1 kg)  Height: '5\' 5"'$  (1.651 m)   Body mass index is 38.21 kg/m. Ideal Body Weight: Weight in (lb) to have BMI = 25: 149.9  GEN: no acute distress. Obese, looks well  HEENT: Atraumatic, Normocephalic.  Bilateral TM wnl, oropharynx normal.  PEERL,EOMI.   Ears and Nose: No external deformity. CV: RRR, No M/G/R. No JVD. No thrill. No extra heart sounds. PULM: CTA B, no wheezes, crackles, rhonchi. No retractions. No resp. distress. No accessory muscle use. ABD: S, NT, ND, +BS. No rebound. No HSM. EXTR: No c/c/e PSYCH: Normally interactive. Conversant.    Assessment and Plan: Physical exam  Hyperlipidemia associated with type 2 diabetes mellitus ([place]) - Plan: Lipid panel, Microalbumin / creatinine urine ratio, simvastatin (ZOCOR) 20 MG tablet  Hypothyroidism (acquired) - Plan: TSH  Controlled type 2 diabetes mellitus without complication, without long-term current use of insulin ([place]) - Plan: Comprehensive metabolic panel, Hemoglobin A1c  Gastric bypass status for obesity - Plan: CBC, VITAMIN D 25 Hydroxy (Vit-D Deficiency, Fractures), Vitamin B12, Ferritin  Screening for deficiency anemia - Plan: CBC  Expiratory wheezing - Plan: montelukast (SINGULAIR) 10 MG  tablet  Anxiety state - Plan: citalopram (CELEXA) 20 MG tablet  Physical exam today.  Encouraged healthy diet and exercise routine Will plan further follow- up pending labs.   Signed Lamar Blinks, MD  Addendum 8/8, received labs as below.  Message to patient  Results for orders placed or performed in visit on 08/20/21  CBC  Result Value Ref Range   WBC 5.3 4.0 - 10.5 K/uL   RBC 4.14 3.87 - 5.11 Mil/uL   Platelets 193.0 150.0 - 400.0 K/uL   Hemoglobin 12.9 12.0 - 15.0 g/dL   HCT 38.4 36.0 - 46.0 %   MCV 92.8 78.0 - 100.0 fl   MCHC 33.7 30.0 - 36.0 g/dL   RDW 12.5 11.5 - 15.5 %  Comprehensive metabolic panel  Result Value Ref Range   Sodium 138 135 - 145 mEq/L   Potassium 4.7 3.5 - 5.1 mEq/L   Chloride 106 96 - 112 mEq/L   CO2 26 19 - 32 mEq/L   Glucose, Bld 97 70 - 99 mg/dL   BUN 14 6 - 23 mg/dL   Creatinine, Ser 0.85 0.40 - 1.20 mg/dL   Total Bilirubin 0.7 0.2 - 1.2 mg/dL   Alkaline Phosphatase 45 39 - 117 U/L   AST 22 0 - 37 U/L   ALT 12 0 - 35 U/L   Total Protein 6.3 6.0 - 8.3 g/dL   Albumin 4.0 3.5 - 5.2 g/dL   GFR 78.37 >60.00 mL/min   Calcium 9.0 8.4 - 10.5 mg/dL  Hemoglobin A1c  Result Value Ref Range   Hgb A1c MFr Bld 5.9 4.6 - 6.5 %  Lipid panel  Result Value Ref Range   Cholesterol 129 0 - 200 mg/dL   Triglycerides 76.0 0.0 - 149.0 mg/dL   HDL 54.40 >39.00 mg/dL   VLDL 15.2 0.0 - 40.0 mg/dL   LDL Cholesterol 59 0 - 99 mg/dL   Total CHOL/HDL Ratio 2    NonHDL 74.18   TSH  Result Value Ref Range   TSH 2.15 0.35 - 5.50 uIU/mL  VITAMIN D 25 Hydroxy (Vit-D Deficiency, Fractures)  Result Value Ref Range   VITD 31.55 30.00 - 100.00 ng/mL  Vitamin B12  Result Value Ref Range   Vitamin B-12 848 211 - 911 pg/mL  Ferritin  Result Value Ref Range   Ferritin 30.7 10.0 - 291.0 ng/mL  Microalbumin / creatinine urine ratio  Result Value Ref Range   Microalb, Ur 3.9 (H) 0.0 - 1.9 mg/dL   Creatinine,U 105.8 mg/dL   Microalb Creat Ratio 3.6 0.0 - 30.0  mg/g

## 2021-08-20 ENCOUNTER — Ambulatory Visit (INDEPENDENT_AMBULATORY_CARE_PROVIDER_SITE_OTHER): Payer: BC Managed Care – PPO | Admitting: Family Medicine

## 2021-08-20 VITALS — BP 112/64 | HR 55 | Temp 97.9°F | Resp 18 | Ht 65.0 in | Wt 229.6 lb

## 2021-08-20 DIAGNOSIS — E1169 Type 2 diabetes mellitus with other specified complication: Secondary | ICD-10-CM

## 2021-08-20 DIAGNOSIS — E039 Hypothyroidism, unspecified: Secondary | ICD-10-CM | POA: Diagnosis not present

## 2021-08-20 DIAGNOSIS — Z130 Encounter for screening for diseases of the blood and blood-forming organs and certain disorders involving the immune mechanism: Secondary | ICD-10-CM

## 2021-08-20 DIAGNOSIS — Z0000 Encounter for general adult medical examination without abnormal findings: Secondary | ICD-10-CM | POA: Diagnosis not present

## 2021-08-20 DIAGNOSIS — E785 Hyperlipidemia, unspecified: Secondary | ICD-10-CM | POA: Diagnosis not present

## 2021-08-20 DIAGNOSIS — F411 Generalized anxiety disorder: Secondary | ICD-10-CM

## 2021-08-20 DIAGNOSIS — R062 Wheezing: Secondary | ICD-10-CM

## 2021-08-20 DIAGNOSIS — Z9884 Bariatric surgery status: Secondary | ICD-10-CM

## 2021-08-20 DIAGNOSIS — E119 Type 2 diabetes mellitus without complications: Secondary | ICD-10-CM

## 2021-08-20 LAB — CBC
HCT: 38.4 % (ref 36.0–46.0)
Hemoglobin: 12.9 g/dL (ref 12.0–15.0)
MCHC: 33.7 g/dL (ref 30.0–36.0)
MCV: 92.8 fl (ref 78.0–100.0)
Platelets: 193 10*3/uL (ref 150.0–400.0)
RBC: 4.14 Mil/uL (ref 3.87–5.11)
RDW: 12.5 % (ref 11.5–15.5)
WBC: 5.3 10*3/uL (ref 4.0–10.5)

## 2021-08-20 LAB — MICROALBUMIN / CREATININE URINE RATIO
Creatinine,U: 105.8 mg/dL
Microalb Creat Ratio: 3.6 mg/g (ref 0.0–30.0)
Microalb, Ur: 3.9 mg/dL — ABNORMAL HIGH (ref 0.0–1.9)

## 2021-08-20 LAB — VITAMIN D 25 HYDROXY (VIT D DEFICIENCY, FRACTURES): VITD: 31.55 ng/mL (ref 30.00–100.00)

## 2021-08-20 LAB — TSH: TSH: 2.15 u[IU]/mL (ref 0.35–5.50)

## 2021-08-20 LAB — FERRITIN: Ferritin: 30.7 ng/mL (ref 10.0–291.0)

## 2021-08-20 LAB — VITAMIN B12: Vitamin B-12: 848 pg/mL (ref 211–911)

## 2021-08-20 LAB — HEMOGLOBIN A1C: Hgb A1c MFr Bld: 5.9 % (ref 4.6–6.5)

## 2021-08-20 MED ORDER — MONTELUKAST SODIUM 10 MG PO TABS: ORAL_TABLET | ORAL | 3 refills | Status: DC

## 2021-08-20 MED ORDER — CITALOPRAM HYDROBROMIDE 20 MG PO TABS: ORAL_TABLET | ORAL | 3 refills | Status: DC

## 2021-08-20 MED ORDER — SIMVASTATIN 20 MG PO TABS: ORAL_TABLET | ORAL | 3 refills | Status: DC

## 2021-08-21 ENCOUNTER — Encounter: Payer: Self-pay | Admitting: Family Medicine

## 2021-08-21 LAB — COMPREHENSIVE METABOLIC PANEL
ALT: 12 U/L (ref 0–35)
AST: 22 U/L (ref 0–37)
Albumin: 4 g/dL (ref 3.5–5.2)
Alkaline Phosphatase: 45 U/L (ref 39–117)
BUN: 14 mg/dL (ref 6–23)
CO2: 26 mEq/L (ref 19–32)
Calcium: 9 mg/dL (ref 8.4–10.5)
Chloride: 106 mEq/L (ref 96–112)
Creatinine, Ser: 0.85 mg/dL (ref 0.40–1.20)
GFR: 78.37 mL/min (ref >60.00)
Glucose, Bld: 97 mg/dL (ref 70–99)
Potassium: 4.7 mEq/L (ref 3.5–5.1)
Sodium: 138 mEq/L (ref 135–145)
Total Bilirubin: 0.7 mg/dL (ref 0.2–1.2)
Total Protein: 6.3 g/dL (ref 6.0–8.3)

## 2021-08-21 LAB — LIPID PANEL
Cholesterol: 129 mg/dL (ref 0–200)
HDL: 54.4 mg/dL (ref >39.00)
LDL Cholesterol: 59 mg/dL (ref 0–99)
NonHDL: 74.18
Total CHOL/HDL Ratio: 2
Triglycerides: 76 mg/dL (ref 0.0–149.0)
VLDL: 15.2 mg/dL (ref 0.0–40.0)

## 2021-08-27 LAB — HM MAMMOGRAPHY

## 2021-08-28 ENCOUNTER — Encounter: Payer: Self-pay | Admitting: *Deleted

## 2022-07-07 ENCOUNTER — Other Ambulatory Visit: Payer: Self-pay | Admitting: Family Medicine

## 2022-07-07 DIAGNOSIS — E039 Hypothyroidism, unspecified: Secondary | ICD-10-CM

## 2022-07-08 ENCOUNTER — Other Ambulatory Visit: Payer: Self-pay

## 2022-08-06 ENCOUNTER — Other Ambulatory Visit: Payer: Self-pay | Admitting: Family Medicine

## 2022-08-06 DIAGNOSIS — R062 Wheezing: Secondary | ICD-10-CM

## 2022-08-06 DIAGNOSIS — E1169 Type 2 diabetes mellitus with other specified complication: Secondary | ICD-10-CM

## 2022-08-06 DIAGNOSIS — E039 Hypothyroidism, unspecified: Secondary | ICD-10-CM

## 2022-08-06 DIAGNOSIS — F411 Generalized anxiety disorder: Secondary | ICD-10-CM

## 2022-08-08 ENCOUNTER — Encounter (HOSPITAL_COMMUNITY): Payer: Self-pay | Admitting: *Deleted

## 2022-08-20 NOTE — Progress Notes (Unsigned)
[facility] [address], [address] [place], [place] [address] [phone]  Date:  08/22/2022   Name:  Lisa Patel   DOB:  1970/02/07   MRN:  [id number]  PCP:  Pearline Cables, MD    Chief Complaint: No chief complaint on file.   History of Present Illness:  Lisa Patel is a 52 y.o. 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 Mammogram due this month Pap completed 2022, up-to-date Colonoscopy completed 2021 Can offer CT coronary calcium  Foot exam Eye exam Tetanus vaccine  Citalopram 20 Levothyroxine 100 Singular Simvastatin 20 Patient Active Problem List   Diagnosis Date Noted   Morbid obesity ([facility]) 12/27/2019   Positive colorectal cancer screening using Cologuard test    Diverticulosis of colon without hemorrhage    Grade II hemorrhoids    Numbness of foot 09/11/2011   Hypothyroid 09/11/2011   Diet-controlled diabetes mellitus ([facility]) 10/02/2009   Hyperlipidemia associated with type 2 diabetes mellitus ([facility]) 10/02/2009   ANXIETY STATE, UNSPECIFIED 09/04/2009   PARESTHESIA 09/04/2009   SNORING 09/04/2009   VITAMIN D DEFICIENCY 08/31/2008   HYPERKALEMIA 08/31/2008   GERD 08/31/2008    Past Medical History:  Diagnosis Date   Diabetes mellitus    GERD (gastroesophageal reflux disease)    Hyperlipidemia    Hypertension    Hypothyroidism    Normal vaginal delivery    02-1993 and 2000 had epidural anesthesia   Obesity    Panic attack    Seasonal allergies    seasonal   Vitamin D deficiency     Past Surgical History:  Procedure Laterality Date   COLONOSCOPY WITH PROPOFOL N/A 01/18/2019   Procedure: COLONOSCOPY WITH  PROPOFOL;  Surgeon: Shellia Cleverly, DO;  [facility];  Service: Gastroenterology;  Laterality: N/A;   GASTRIC ROUX-EN-Y N/A 12/27/2019   Procedure: LAPAROSCOPIC ROUX-EN-Y GASTRIC BYPASS WITH UPPER ENDOSCOPY;  Surgeon: Kinsinger, De Blanch, MD;  Location: [facility];  Service: General;  Laterality: N/A;   UPPER GI ENDOSCOPY N/A 12/27/2019   Procedure: UPPER GI ENDOSCOPY;  Surgeon: Sheliah Hatch, De Blanch, MD;  Location: [facility];  Service: General;  Laterality: N/A;    Social History   Tobacco Use   Smoking status: Never   Smokeless tobacco: Never  Vaping Use   Vaping status: Never Used  Substance Use Topics   Alcohol use: Not Currently   Drug use: Never    Family History  Problem Relation Age of Onset   Diabetes Maternal Grandmother    Clotting disorder Maternal Grandmother    Diabetes Maternal Grandfather    Diabetes Paternal Grandmother    Diabetes Paternal Grandfather    Colon cancer Neg Hx    Esophageal cancer Neg Hx    Colon polyps Neg Hx    Rectal cancer Neg Hx    Stomach cancer Neg Hx     No Known Allergies  Medication list has been reviewed and updated.  Current Outpatient Medications on File Prior to Visit  Medication Sig Dispense Refill   acetaminophen (TYLENOL) 500 MG tablet Take 2,000 mg by mouth daily.     citalopram (CELEXA) 20 MG tablet Take 1 tablet by mouth daily. 90 tablet 0  erythromycin ophthalmic ointment Place 1 application. into the left eye in the morning and at bedtime. 14 g 0   levothyroxine (SYNTHROID) 100 MCG tablet Take 1 tablet by mouth daily. Need appointment for additional refills. 90 tablet 0   montelukast (SINGULAIR) 10 MG tablet Take 1 tablet by mouth at bedtime. 90 tablet 0   Multiple Vitamins-Minerals (BARIATRIC MULTIVITAMINS/IRON PO) Take by mouth.     omeprazole (PRILOSEC) 20 MG capsule Take 20 mg by mouth daily.     simvastatin (ZOCOR) 20 MG tablet Take 1 tablet by mouth at bedtime. 90 tablet 0   [DISCONTINUED] gabapentin  (NEURONTIN) 100 MG capsule Take 2 capsules (200 mg total) by mouth every 12 (twelve) hours. 20 capsule 0   No current facility-administered medications on file prior to visit.    Review of Systems:  As per HPI- otherwise negative.   Physical Examination: There were no vitals filed for this visit. There were no vitals filed for this visit. There is no height or weight on file to calculate BMI. Ideal Body Weight:    GEN: no acute distress. HEENT: Atraumatic, Normocephalic.  Ears and Nose: No external deformity. CV: RRR, No M/G/R. No JVD. No thrill. No extra heart sounds. PULM: CTA B, no wheezes, crackles, rhonchi. No retractions. No resp. distress. No accessory muscle use. ABD: S, NT, ND, +BS. No rebound. No HSM. EXTR: No c/c/e PSYCH: Normally interactive. Conversant.  Foot exam  Assessment and Plan: *** Physical exam today.  Encouraged healthy diet and exercise routine Will plan further follow- up pending labs.  Signed Abbe Amsterdam, MD

## 2022-08-20 NOTE — Patient Instructions (Signed)
It was great to see you again today, I will be in touch with your labs Recommend COVID booster and flu shot this fall Do not forget annual eye exam!  Assuming all is well we can plan to follow-up in about 6 months  I ordered your CT coronary calcium, if you would like stop by imaging on your way out.  They might be to do this for you while you are here

## 2022-08-22 ENCOUNTER — Ambulatory Visit (INDEPENDENT_AMBULATORY_CARE_PROVIDER_SITE_OTHER): Payer: BC Managed Care – PPO | Admitting: Family Medicine

## 2022-08-22 ENCOUNTER — Encounter: Payer: Self-pay | Admitting: Family Medicine

## 2022-08-22 VITALS — BP 118/60 | HR 74 | Temp 98.2°F | Resp 18 | Ht 65.0 in | Wt 253.8 lb

## 2022-08-22 DIAGNOSIS — Z0000 Encounter for general adult medical examination without abnormal findings: Secondary | ICD-10-CM

## 2022-08-22 DIAGNOSIS — E039 Hypothyroidism, unspecified: Secondary | ICD-10-CM | POA: Diagnosis not present

## 2022-08-22 DIAGNOSIS — E785 Hyperlipidemia, unspecified: Secondary | ICD-10-CM

## 2022-08-22 DIAGNOSIS — E119 Type 2 diabetes mellitus without complications: Secondary | ICD-10-CM | POA: Diagnosis not present

## 2022-08-22 DIAGNOSIS — Z130 Encounter for screening for diseases of the blood and blood-forming organs and certain disorders involving the immune mechanism: Secondary | ICD-10-CM

## 2022-08-22 DIAGNOSIS — Z9884 Bariatric surgery status: Secondary | ICD-10-CM | POA: Diagnosis not present

## 2022-08-22 DIAGNOSIS — R062 Wheezing: Secondary | ICD-10-CM

## 2022-08-22 DIAGNOSIS — F411 Generalized anxiety disorder: Secondary | ICD-10-CM

## 2022-08-22 DIAGNOSIS — E1169 Type 2 diabetes mellitus with other specified complication: Secondary | ICD-10-CM

## 2022-08-22 DIAGNOSIS — Z23 Encounter for immunization: Secondary | ICD-10-CM

## 2022-08-22 LAB — COMPREHENSIVE METABOLIC PANEL
ALT: 11 U/L (ref 0–35)
AST: 21 U/L (ref 0–37)
Albumin: 4 g/dL (ref 3.5–5.2)
Alkaline Phosphatase: 42 U/L (ref 39–117)
BUN: 14 mg/dL (ref 6–23)
CO2: 28 mEq/L (ref 19–32)
Calcium: 8.8 mg/dL (ref 8.4–10.5)
Chloride: 106 mEq/L (ref 96–112)
Creatinine, Ser: 0.72 mg/dL (ref 0.40–1.20)
GFR: 94.98 mL/min (ref >60.00)
Glucose, Bld: 109 mg/dL — ABNORMAL HIGH (ref 70–99)
Potassium: 4.8 mEq/L (ref 3.5–5.1)
Sodium: 139 mEq/L (ref 135–145)
Total Bilirubin: 0.9 mg/dL (ref 0.2–1.2)
Total Protein: 6.3 g/dL (ref 6.0–8.3)

## 2022-08-22 LAB — CBC
HCT: 38.1 % (ref 36.0–46.0)
Hemoglobin: 12.6 g/dL (ref 12.0–15.0)
MCHC: 33.1 g/dL (ref 30.0–36.0)
MCV: 93.1 fl (ref 78.0–100.0)
Platelets: 215 10*3/uL (ref 150.0–400.0)
RBC: 4.09 Mil/uL (ref 3.87–5.11)
RDW: 12.5 % (ref 11.5–15.5)
WBC: 4.2 10*3/uL (ref 4.0–10.5)

## 2022-08-22 LAB — LIPID PANEL
Cholesterol: 136 mg/dL (ref 0–200)
HDL: 59.9 mg/dL (ref >39.00)
LDL Cholesterol: 64 mg/dL (ref 0–99)
NonHDL: 76.06
Total CHOL/HDL Ratio: 2
Triglycerides: 61 mg/dL (ref 0.0–149.0)
VLDL: 12.2 mg/dL (ref 0.0–40.0)

## 2022-08-22 LAB — HEMOGLOBIN A1C: Hgb A1c MFr Bld: 6 % (ref 4.6–6.5)

## 2022-08-22 LAB — MICROALBUMIN / CREATININE URINE RATIO
Creatinine,U: 164 mg/dL
Microalb Creat Ratio: 0.5 mg/g (ref 0.0–30.0)
Microalb, Ur: 0.9 mg/dL (ref 0.0–1.9)

## 2022-08-22 LAB — FERRITIN: Ferritin: 42.1 ng/mL (ref 10.0–291.0)

## 2022-08-22 LAB — VITAMIN D 25 HYDROXY (VIT D DEFICIENCY, FRACTURES): VITD: 37.02 ng/mL (ref 30.00–100.00)

## 2022-08-22 LAB — TSH: TSH: 1.58 u[IU]/mL (ref 0.35–5.50)

## 2022-08-22 LAB — VITAMIN B12: Vitamin B-12: 1055 pg/mL — ABNORMAL HIGH (ref 211–911)

## 2022-08-22 MED ORDER — SIMVASTATIN 20 MG PO TABS
ORAL_TABLET | ORAL | 3 refills | Status: DC
Start: 2022-08-22 — End: 2023-07-28

## 2022-08-22 MED ORDER — MONTELUKAST SODIUM 10 MG PO TABS
ORAL_TABLET | ORAL | 3 refills | Status: DC
Start: 2022-08-22 — End: 2023-07-28

## 2022-08-22 MED ORDER — CITALOPRAM HYDROBROMIDE 20 MG PO TABS
ORAL_TABLET | ORAL | 3 refills | Status: DC
Start: 2022-08-22 — End: 2023-07-28

## 2022-08-30 ENCOUNTER — Other Ambulatory Visit (HOSPITAL_BASED_OUTPATIENT_CLINIC_OR_DEPARTMENT_OTHER): Payer: BC Managed Care – PPO

## 2022-09-02 LAB — HM MAMMOGRAPHY

## 2022-09-04 ENCOUNTER — Encounter: Payer: Self-pay | Admitting: Family Medicine

## 2022-09-13 ENCOUNTER — Ambulatory Visit (HOSPITAL_BASED_OUTPATIENT_CLINIC_OR_DEPARTMENT_OTHER): Admission: RE | Admit: 2022-09-13 | Payer: BC Managed Care – PPO | Source: Ambulatory Visit

## 2022-09-13 DIAGNOSIS — E785 Hyperlipidemia, unspecified: Secondary | ICD-10-CM | POA: Insufficient documentation

## 2022-09-13 DIAGNOSIS — E119 Type 2 diabetes mellitus without complications: Secondary | ICD-10-CM | POA: Insufficient documentation

## 2022-09-13 DIAGNOSIS — E1169 Type 2 diabetes mellitus with other specified complication: Secondary | ICD-10-CM | POA: Insufficient documentation

## 2022-09-16 ENCOUNTER — Encounter: Payer: Self-pay | Admitting: Family Medicine

## 2022-09-20 ENCOUNTER — Encounter: Payer: Self-pay | Admitting: Family Medicine

## 2022-10-30 ENCOUNTER — Other Ambulatory Visit: Payer: Self-pay | Admitting: Family Medicine

## 2022-10-30 DIAGNOSIS — E039 Hypothyroidism, unspecified: Secondary | ICD-10-CM

## 2023-04-28 ENCOUNTER — Other Ambulatory Visit: Payer: Self-pay | Admitting: Family Medicine

## 2023-04-28 DIAGNOSIS — E039 Hypothyroidism, unspecified: Secondary | ICD-10-CM

## 2023-07-27 ENCOUNTER — Other Ambulatory Visit: Payer: Self-pay | Admitting: Family Medicine

## 2023-07-27 DIAGNOSIS — E1169 Type 2 diabetes mellitus with other specified complication: Secondary | ICD-10-CM

## 2023-07-27 DIAGNOSIS — F411 Generalized anxiety disorder: Secondary | ICD-10-CM

## 2023-07-27 DIAGNOSIS — E039 Hypothyroidism, unspecified: Secondary | ICD-10-CM

## 2023-07-27 DIAGNOSIS — R062 Wheezing: Secondary | ICD-10-CM

## 2023-08-01 ENCOUNTER — Encounter (HOSPITAL_COMMUNITY): Payer: Self-pay | Admitting: *Deleted

## 2023-08-26 NOTE — Patient Instructions (Addendum)
 It was great to see you today, I will be in touch with your labs as soon as possible- we will do your pap in October   Please plan to get a flu shot this fall and a COVID booster if available

## 2023-08-26 NOTE — Progress Notes (Addendum)
  [facility] [address], [address] [place], [place] [address] [phone]  Date:  08/27/2023   Name:  Lisa Patel   DOB:  [birth year]   MRN:  [id number]  PCP:  Watt Harlene BROCKS, MD    Chief Complaint: Annual Exam   History of Present Illness:  Lisa Patel is a 53 y.o. very pleasant female patient who presents with the following:  Patient is in today for physical exam.  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  She has a menstrual bleed about every 6 weeks, she notes they can be heavy and uncomfortable.  I offered to have her see GYN for a possible IUD but she declines for now Tetanus up-to-date Shingrix  is complete  Simvastatin  20 Omeprazole  Singulair  Levothyroxine  100 Citalopram  20 Colon 2021 Patient Active Problem List   Diagnosis Date Noted   Morbid obesity (HCC) 12/27/2019   Positive colorectal cancer screening using Cologuard test    Diverticulosis of colon without hemorrhage    Grade II hemorrhoids    Numbness of foot 09/11/2011   Hypothyroid 09/11/2011   Diet-controlled diabetes mellitus (HCC) 10/02/2009   Hyperlipidemia associated with type 2 diabetes mellitus (HCC) 10/02/2009   Anxiety state 09/04/2009   PARESTHESIA 09/04/2009   SNORING 09/04/2009   Vitamin D  deficiency 08/31/2008   HYPERKALEMIA 08/31/2008   GERD 08/31/2008    Past  Medical History:  Diagnosis Date   Diabetes mellitus    GERD (gastroesophageal reflux disease)    Hyperlipidemia    Hypertension    Hypothyroidism    Normal vaginal delivery    02-1993 and 2000 had epidural anesthesia   Obesity    Panic attack    Seasonal allergies    seasonal   Vitamin D  deficiency     Past Surgical History:  Procedure Laterality Date   COLONOSCOPY WITH PROPOFOL  N/A 01/18/2019   Procedure: COLONOSCOPY WITH PROPOFOL ;  Surgeon: San Sandor GAILS, DO;  Location: [facility];  Service: Gastroenterology;  Laterality: N/A;   GASTRIC ROUX-EN-Y N/A 12/27/2019   Procedure: LAPAROSCOPIC ROUX-EN-Y GASTRIC BYPASS WITH UPPER ENDOSCOPY;  Surgeon: Kinsinger, Herlene Righter, MD;  Location: [facility];  Service: General;  Laterality: N/A;   UPPER GI ENDOSCOPY N/A 12/27/2019   Procedure: UPPER GI ENDOSCOPY;  Surgeon: Stevie, Herlene Righter, MD;  Location: [facility];  Service: General;  Laterality: N/A;    Social History   Tobacco Use   Smoking status: Never   Smokeless tobacco: Never  Vaping Use   Vaping status: Never Used  Substance Use Topics   Alcohol use: Not Currently   Drug use: Never    Family History  Problem Relation Age of Onset   Diabetes Maternal Grandmother    Clotting disorder Maternal Grandmother    Diabetes Maternal Grandfather    Diabetes Paternal Grandmother    Diabetes Paternal Grandfather    Colon cancer Neg Hx  Esophageal cancer Neg Hx    Colon polyps Neg Hx    Rectal cancer Neg Hx    Stomach cancer Neg Hx     No Known Allergies  Medication list has been reviewed and updated.  Current Outpatient Medications on File Prior to Visit  Medication Sig Dispense Refill   acetaminophen  (TYLENOL ) 500 MG tablet Take 2,000 mg by mouth daily.     citalopram  (CELEXA ) 20 MG tablet Take 1 tablet by mouth daily. 90 tablet 2   levothyroxine  (SYNTHROID ) 100 MCG tablet Take 1 tablet by mouth daily before breakfast. 90 tablet 0   montelukast  (SINGULAIR ) 10 MG tablet  Take 1 tablet by mouth at bedtime. 90 tablet 2   Multiple Vitamins-Minerals (BARIATRIC MULTIVITAMINS/IRON PO) Take by mouth.     omeprazole  (PRILOSEC) 20 MG capsule Take 20 mg by mouth daily.     simvastatin  (ZOCOR ) 20 MG tablet Take 1 tablet by mouth at bedtime. 90 tablet 2   [DISCONTINUED] gabapentin  (NEURONTIN ) 100 MG capsule Take 2 capsules (200 mg total) by mouth every 12 (twelve) hours. 20 capsule 0   No current facility-administered medications on file prior to visit.    Review of Systems:  As per HPI- otherwise negative.   Physical Examination: Vitals:   08/27/23 1529  BP: 136/82  Pulse: 82  Resp: 18  Temp: 98 F (36.7 C)  SpO2: 98%   Vitals:   08/27/23 1529  Weight: 264 lb 9.6 oz (120 kg)  Height: 5' 5 (1.651 m)   Body mass index is 44.03 kg/m. Ideal Body Weight: Weight in (lb) to have BMI = 25: 149.9  GEN: no acute distress.  Obese, looks well HEENT: Atraumatic, Normocephalic.  Bilateral TM wnl, oropharynx normal.  PEERL,EOMI.   Ears and Nose: No external deformity. CV: RRR, No M/G/R. No JVD. No thrill. No extra heart sounds. PULM: CTA B, no wheezes, crackles, rhonchi. No retractions. No resp. distress. No accessory muscle use. ABD: S, NT, ND, +BS. No rebound. No HSM. EXTR: No c/c/e PSYCH: Normally interactive. Conversant. ' Foot exam  Assessment and Plan: Physical exam  Hyperlipidemia associated with type 2 diabetes mellitus (HCC) - Plan: Lipid panel  Hypothyroidism (acquired) - Plan: TSH  Controlled type 2 diabetes mellitus without complication, without long-term current use of insulin  (HCC) - Plan: Comprehensive metabolic panel with GFR, Hemoglobin A1c  Screening for deficiency anemia - Plan: CBC  Screening for cervical cancer  History of gastric bypass - Plan: Vitamin B12, Ferritin  Physical exam today.  Encouraged healthy diet and exercise routine Will plan further follow- up pending labs. She will come back another day for her Pap and urine  micro, schedule this for October Diabetes has been typically well-controlled, check A1c Follow-up vitamin B12 and ferritin due to history of gastric bypass  Signed Harlene Schroeder, MD  Addendum 8/14, received labs as below.  Message to patient  Results for orders placed or performed in visit on 08/27/23  CBC   Collection Time: 08/27/23  4:13 PM  Result Value Ref Range   WBC 5.7 4.0 - 10.5 K/uL   RBC 4.14 3.87 - 5.11 Mil/uL   Platelets 220.0 150.0 - 400.0 K/uL   Hemoglobin 13.0 12.0 - 15.0 g/dL   HCT 61.5 63.9 - 53.9 %   MCV 92.9 78.0 - 100.0 fl   MCHC 33.7 30.0 - 36.0 g/dL   RDW 87.2 88.4 - 84.4 %  Comprehensive metabolic panel with GFR   Collection Time: 08/27/23  4:13 PM  Result Value Ref Range   Sodium 136 135 - 145 mEq/L   Potassium 5.2 No hemolysis seen (H) 3.5 - 5.1 mEq/L   Chloride 102 96 - 112 mEq/L   CO2 27 19 - 32 mEq/L   Glucose, Bld 95 70 - 99 mg/dL   BUN 19 6 - 23 mg/dL   Creatinine, Ser 9.12 0.40 - 1.20 mg/dL   Total Bilirubin 0.5 0.2 - 1.2 mg/dL   Alkaline Phosphatase 47 39 - 117 U/L   AST 21 0 - 37 U/L   ALT 11 0 - 35 U/L   Total Protein 7.0 6.0 - 8.3 g/dL   Albumin  4.5 3.5 - 5.2 g/dL   GFR 24.85 >39.99 mL/min   Calcium 9.5 8.4 - 10.5 mg/dL  Hemoglobin J8r   Collection Time: 08/27/23  4:13 PM  Result Value Ref Range   Hgb A1c MFr Bld 6.4 4.6 - 6.5 %  Lipid panel   Collection Time: 08/27/23  4:13 PM  Result Value Ref Range   Cholesterol 148 0 - 200 mg/dL   Triglycerides 05.9 0.0 - 149.0 mg/dL   HDL 37.19 >60.99 mg/dL   VLDL 81.1 0.0 - 59.9 mg/dL   LDL Cholesterol 67 0 - 99 mg/dL   Total CHOL/HDL Ratio 2    NonHDL 85.38   TSH   Collection Time: 08/27/23  4:13 PM  Result Value Ref Range   TSH 2.16 0.35 - 5.50 uIU/mL  Vitamin B12   Collection Time: 08/27/23  4:13 PM  Result Value Ref Range   Vitamin B-12 746 211 - 911 pg/mL  Ferritin   Collection Time: 08/27/23  4:13 PM  Result Value Ref Range   Ferritin 25.3 10.0 - 291.0 ng/mL

## 2023-08-27 ENCOUNTER — Ambulatory Visit (INDEPENDENT_AMBULATORY_CARE_PROVIDER_SITE_OTHER): Admitting: Family Medicine

## 2023-08-27 VITALS — BP 136/82 | HR 82 | Temp 98.0°F | Resp 18 | Ht 65.0 in | Wt 264.6 lb

## 2023-08-27 DIAGNOSIS — Z9884 Bariatric surgery status: Secondary | ICD-10-CM | POA: Diagnosis not present

## 2023-08-27 DIAGNOSIS — E785 Hyperlipidemia, unspecified: Secondary | ICD-10-CM

## 2023-08-27 DIAGNOSIS — E039 Hypothyroidism, unspecified: Secondary | ICD-10-CM

## 2023-08-27 DIAGNOSIS — Z0000 Encounter for general adult medical examination without abnormal findings: Secondary | ICD-10-CM

## 2023-08-27 DIAGNOSIS — Z124 Encounter for screening for malignant neoplasm of cervix: Secondary | ICD-10-CM

## 2023-08-27 DIAGNOSIS — E119 Type 2 diabetes mellitus without complications: Secondary | ICD-10-CM

## 2023-08-27 DIAGNOSIS — Z130 Encounter for screening for diseases of the blood and blood-forming organs and certain disorders involving the immune mechanism: Secondary | ICD-10-CM | POA: Diagnosis not present

## 2023-08-27 DIAGNOSIS — E1169 Type 2 diabetes mellitus with other specified complication: Secondary | ICD-10-CM | POA: Diagnosis not present

## 2023-08-28 ENCOUNTER — Encounter: Payer: Self-pay | Admitting: Family Medicine

## 2023-08-28 LAB — COMPREHENSIVE METABOLIC PANEL WITH GFR
ALT: 11 U/L (ref 0–35)
AST: 21 U/L (ref 0–37)
Albumin: 4.5 g/dL (ref 3.5–5.2)
Alkaline Phosphatase: 47 U/L (ref 39–117)
BUN: 19 mg/dL (ref 6–23)
CO2: 27 meq/L (ref 19–32)
Calcium: 9.5 mg/dL (ref 8.4–10.5)
Chloride: 102 meq/L (ref 96–112)
Creatinine, Ser: 0.87 mg/dL (ref 0.40–1.20)
GFR: 75.14 mL/min (ref >60.00)
Glucose, Bld: 95 mg/dL (ref 70–99)
Potassium: 5.2 meq/L — ABNORMAL HIGH (ref 3.5–5.1)
Sodium: 136 meq/L (ref 135–145)
Total Bilirubin: 0.5 mg/dL (ref 0.2–1.2)
Total Protein: 7 g/dL (ref 6.0–8.3)

## 2023-08-28 LAB — LIPID PANEL
Cholesterol: 148 mg/dL (ref 0–200)
HDL: 62.8 mg/dL (ref >39.00)
LDL Cholesterol: 67 mg/dL (ref 0–99)
NonHDL: 85.38
Total CHOL/HDL Ratio: 2
Triglycerides: 94 mg/dL (ref 0.0–149.0)
VLDL: 18.8 mg/dL (ref 0.0–40.0)

## 2023-08-28 LAB — VITAMIN B12: Vitamin B-12: 746 pg/mL (ref 211–911)

## 2023-08-28 LAB — HEMOGLOBIN A1C: Hgb A1c MFr Bld: 6.4 % (ref 4.6–6.5)

## 2023-08-28 LAB — TSH: TSH: 2.16 u[IU]/mL (ref 0.35–5.50)

## 2023-08-28 LAB — CBC
HCT: 38.4 % (ref 36.0–46.0)
Hemoglobin: 13 g/dL (ref 12.0–15.0)
MCHC: 33.7 g/dL (ref 30.0–36.0)
MCV: 92.9 fl (ref 78.0–100.0)
Platelets: 220 K/uL (ref 150.0–400.0)
RBC: 4.14 Mil/uL (ref 3.87–5.11)
RDW: 12.7 % (ref 11.5–15.5)
WBC: 5.7 K/uL (ref 4.0–10.5)

## 2023-08-28 LAB — FERRITIN: Ferritin: 25.3 ng/mL (ref 10.0–291.0)

## 2023-09-24 LAB — HM MAMMOGRAPHY

## 2023-09-26 ENCOUNTER — Encounter: Payer: Self-pay | Admitting: Family Medicine

## 2023-10-13 NOTE — Patient Instructions (Signed)
 It was great to see you again today I do recommend a COVID booster this fall

## 2023-10-13 NOTE — Progress Notes (Unsigned)
 [facility] [address], [address] [place], [place] [address] [phone]  Date:  10/16/2023   Name:  Lisa Patel   DOB:  10/21/1970   MRN:  [id number]  PCP:  Watt Harlene BROCKS, MD    Chief Complaint: No chief complaint on file.   History of Present Illness:  Lisa Patel is a 53 y.o. very pleasant female patient who presents with the following:  Patient seen today for Pap smear and urine micro testing I saw her for physical in August-we did not do her Pap at that time due to menstruation History of diet controlled diabetes, hyperlipidemia, hypothyroidism, obesity She underwent gastric bypass surgery in December 2021  -eye exam -Flu shot - Recommend COVID booster - Need urine micro  Lab Results  Component Value Date   HGBA1C 6.4 08/27/2023     Discussed the use of AI scribe software for clinical note transcription with the patient, who gave verbal consent to proceed.  History of Present Illness     Patient Active Problem List   Diagnosis Date Noted   Morbid obesity (HCC) 12/27/2019   Positive colorectal cancer screening using Cologuard test    Diverticulosis of colon without hemorrhage    Grade II hemorrhoids    Numbness of foot 09/11/2011   Hypothyroid 09/11/2011   Diet-controlled diabetes mellitus (HCC) 10/02/2009   Hyperlipidemia associated with type 2 diabetes mellitus (HCC) 10/02/2009   Anxiety state 09/04/2009   PARESTHESIA 09/04/2009   SNORING 09/04/2009   Vitamin D  deficiency 08/31/2008   HYPERKALEMIA 08/31/2008   GERD 08/31/2008    Past Medical History:  Diagnosis Date   Diabetes mellitus    GERD (gastroesophageal reflux disease)    Hyperlipidemia    Hypertension    Hypothyroidism    Normal vaginal delivery    02-1993 and 2000 had epidural anesthesia   Obesity    Panic attack    Seasonal allergies    seasonal   Vitamin D  deficiency     Past Surgical History:  Procedure  Laterality Date   COLONOSCOPY WITH PROPOFOL  N/A 01/18/2019   Procedure: COLONOSCOPY WITH PROPOFOL ;  Surgeon: San Sandor GAILS, DO;  Location: [facility];  Service: Gastroenterology;  Laterality: N/A;   GASTRIC ROUX-EN-Y N/A 12/27/2019   Procedure: LAPAROSCOPIC ROUX-EN-Y GASTRIC BYPASS WITH UPPER ENDOSCOPY;  Surgeon: Kinsinger, Herlene Righter, MD;  Location: [facility];  Service: General;  Laterality: N/A;   UPPER GI ENDOSCOPY N/A 12/27/2019   Procedure: UPPER GI ENDOSCOPY;  Surgeon: Stevie, Herlene Righter, MD;  Location: [facility];  Service: General;  Laterality: N/A;    Social History   Tobacco Use   Smoking status: Never   Smokeless tobacco: Never  Vaping Use   Vaping status: Never Used  Substance Use Topics   Alcohol use: Not Currently   Drug use: Never    Family History  Problem Relation Age of Onset   Diabetes Maternal Grandmother    Clotting disorder Maternal Grandmother    Diabetes Maternal Grandfather    Diabetes Paternal Grandmother    Diabetes Paternal Grandfather    Colon cancer Neg Hx    Esophageal cancer Neg Hx    Colon polyps Neg Hx    Rectal cancer Neg Hx    Stomach cancer Neg Hx     No Known Allergies  Medication list has been reviewed and updated.  Current Outpatient Medications on File Prior to Visit  Medication Sig Dispense Refill   acetaminophen  (  TYLENOL ) 500 MG tablet Take 2,000 mg by mouth daily.     citalopram  (CELEXA ) 20 MG tablet Take 1 tablet by mouth daily. 90 tablet 2   levothyroxine  (SYNTHROID ) 100 MCG tablet Take 1 tablet by mouth daily before breakfast. 90 tablet 0   montelukast  (SINGULAIR ) 10 MG tablet Take 1 tablet by mouth at bedtime. 90 tablet 2   Multiple Vitamins-Minerals (BARIATRIC MULTIVITAMINS/IRON PO) Take by mouth.     omeprazole  (PRILOSEC) 20 MG capsule Take 20 mg by mouth daily.     simvastatin  (ZOCOR ) 20 MG tablet Take 1 tablet by mouth at bedtime. 90 tablet 2   [DISCONTINUED] gabapentin  (NEURONTIN ) 100 MG capsule Take 2 capsules (200  mg total) by mouth every 12 (twelve) hours. 20 capsule 0   No current facility-administered medications on file prior to visit.    Review of Systems:  As per HPI- otherwise negative.   Physical Examination: There were no vitals filed for this visit. There were no vitals filed for this visit. There is no height or weight on file to calculate BMI. Ideal Body Weight:    GEN: no acute distress. HEENT: Atraumatic, Normocephalic.  Ears and Nose: No external deformity. CV: RRR, No M/G/R. No JVD. No thrill. No extra heart sounds. PULM: CTA B, no wheezes, crackles, rhonchi. No retractions. No resp. distress. No accessory muscle use. ABD: S, NT, ND, +BS. No rebound. No HSM. EXTR: No c/c/e PSYCH: Normally interactive. Conversant.    Assessment and Plan: No diagnosis found.  Assessment & Plan   Signed Harlene Schroeder, MD

## 2023-10-14 ENCOUNTER — Encounter: Payer: Self-pay | Admitting: Family Medicine

## 2023-10-16 ENCOUNTER — Encounter: Payer: Self-pay | Admitting: Family Medicine

## 2023-10-16 ENCOUNTER — Ambulatory Visit: Admitting: Family Medicine

## 2023-10-16 ENCOUNTER — Other Ambulatory Visit (HOSPITAL_COMMUNITY)
Admission: RE | Admit: 2023-10-16 | Discharge: 2023-10-16 | Disposition: A | Discharge status: Home / Self Care | Source: Ambulatory Visit | Attending: Family Medicine | Admitting: Family Medicine

## 2023-10-16 VITALS — BP 116/72 | HR 73 | Ht 65.0 in | Wt 265.6 lb

## 2023-10-16 DIAGNOSIS — Z124 Encounter for screening for malignant neoplasm of cervix: Secondary | ICD-10-CM | POA: Insufficient documentation

## 2023-10-16 DIAGNOSIS — E119 Type 2 diabetes mellitus without complications: Secondary | ICD-10-CM | POA: Diagnosis not present

## 2023-10-16 DIAGNOSIS — N9089 Other specified noninflammatory disorders of vulva and perineum: Secondary | ICD-10-CM | POA: Diagnosis not present

## 2023-10-17 ENCOUNTER — Encounter: Payer: Self-pay | Admitting: Family Medicine

## 2023-10-17 LAB — CYTOLOGY - PAP
Comment: NEGATIVE
Diagnosis: NEGATIVE
High risk HPV: NEGATIVE

## 2023-10-20 ENCOUNTER — Encounter: Payer: Self-pay | Admitting: Family Medicine

## 2023-10-30 ENCOUNTER — Other Ambulatory Visit: Payer: Self-pay | Admitting: Family Medicine

## 2023-10-30 DIAGNOSIS — E039 Hypothyroidism, unspecified: Secondary | ICD-10-CM

## 2023-12-06 NOTE — Progress Notes (Unsigned)
 [facility] [address], [address] [place], [place] [address] [phone]  Date:  12/08/2023   Name:  Ryland Smoots   DOB:  1970-12-11   MRN:  [id number]  PCP:  Watt Harlene BROCKS, MD    Chief Complaint: No chief complaint on file.   History of Present Illness:  Perry Molla is a 53 y.o. very pleasant female patient who presents with the following:  Patient seen today for follow-up, she is also concerned about a possible indentation in her skull.  I saw her most recently last month to update her Pap smear History of diet controlled diabetes, hyperlipidemia, hypothyroidism, obesity She underwent gastric bypass surgery in December 2021 No history of abnormal pap except maybe when she was much younger- all ok since  Otherwise her most recent labs were done in August-A1c 6.4% Citalopram  Levothyroxine  100 Singulair  Simvastatin   Eye exam Flu shot Can update urine micro  Discussed the use of AI scribe software for clinical note transcription with the patient, who gave verbal consent to proceed.  History of Present Illness    Patient Active Problem List   Diagnosis Date Noted   Morbid obesity (HCC) 12/27/2019   Positive colorectal cancer screening using Cologuard test    Diverticulosis of colon without hemorrhage    Grade II hemorrhoids    Numbness of foot 09/11/2011   Hypothyroid 09/11/2011   Diet-controlled diabetes mellitus (HCC) 10/02/2009   Hyperlipidemia associated with type 2 diabetes mellitus (HCC) 10/02/2009   Anxiety state 09/04/2009   PARESTHESIA 09/04/2009   SNORING 09/04/2009   Vitamin D  deficiency 08/31/2008   HYPERKALEMIA 08/31/2008   GERD 08/31/2008    Past Medical History:  Diagnosis Date   Diabetes mellitus    GERD (gastroesophageal reflux disease)    Hyperlipidemia    Hypertension    Hypothyroidism    Normal vaginal delivery    02-1993 and 2000 had epidural anesthesia   Obesity     Panic attack    Seasonal allergies    seasonal   Vitamin D  deficiency     Past Surgical History:  Procedure Laterality Date   COLONOSCOPY WITH PROPOFOL  N/A 01/18/2019   Procedure: COLONOSCOPY WITH PROPOFOL ;  Surgeon: San Sandor GAILS, DO;  Location: [facility];  Service: Gastroenterology;  Laterality: N/A;   GASTRIC ROUX-EN-Y N/A 12/27/2019   Procedure: LAPAROSCOPIC ROUX-EN-Y GASTRIC BYPASS WITH UPPER ENDOSCOPY;  Surgeon: Kinsinger, Herlene Righter, MD;  Location: [facility];  Service: General;  Laterality: N/A;   UPPER GI ENDOSCOPY N/A 12/27/2019   Procedure: UPPER GI ENDOSCOPY;  Surgeon: Stevie, Herlene Righter, MD;  Location: [facility];  Service: General;  Laterality: N/A;    Social History   Tobacco Use   Smoking status: Never   Smokeless tobacco: Never  Vaping Use   Vaping status: Never Used  Substance Use Topics   Alcohol use: Not Currently   Drug use: Never    Family History  Problem Relation Age of Onset   Diabetes Maternal Grandmother    Clotting disorder Maternal Grandmother    Diabetes Maternal Grandfather    Diabetes Paternal Grandmother    Diabetes Paternal Grandfather    Colon cancer Neg Hx    Esophageal cancer Neg Hx    Colon polyps Neg Hx    Rectal cancer Neg Hx    Stomach cancer Neg Hx     No Known Allergies  Medication list has been reviewed and updated.  Current Outpatient Medications on  File Prior to Visit  Medication Sig Dispense Refill   acetaminophen  (TYLENOL ) 500 MG tablet Take 2,000 mg by mouth daily.     citalopram  (CELEXA ) 20 MG tablet Take 1 tablet by mouth daily. 90 tablet 2   levothyroxine  (SYNTHROID ) 100 MCG tablet Take 1 tablet (100 mcg total) by mouth daily before breakfast. 90 tablet 1   montelukast  (SINGULAIR ) 10 MG tablet Take 1 tablet by mouth at bedtime. 90 tablet 2   Multiple Vitamins-Minerals (BARIATRIC MULTIVITAMINS/IRON PO) Take by mouth.     omeprazole  (PRILOSEC) 20 MG capsule Take 20 mg by mouth daily.     simvastatin  (ZOCOR ) 20 MG  tablet Take 1 tablet by mouth at bedtime. 90 tablet 2   [DISCONTINUED] gabapentin  (NEURONTIN ) 100 MG capsule Take 2 capsules (200 mg total) by mouth every 12 (twelve) hours. 20 capsule 0   No current facility-administered medications on file prior to visit.    Review of Systems:  As per HPI- otherwise negative.   Physical Examination: There were no vitals filed for this visit. There were no vitals filed for this visit. There is no height or weight on file to calculate BMI. Ideal Body Weight:    GEN: no acute distress. HEENT: Atraumatic, Normocephalic.  Ears and Nose: No external deformity. CV: RRR, No M/G/R. No JVD. No thrill. No extra heart sounds. PULM: CTA B, no wheezes, crackles, rhonchi. No retractions. No resp. distress. No accessory muscle use. ABD: S, NT, ND, +BS. No rebound. No HSM. EXTR: No c/c/e PSYCH: Normally interactive. Conversant.    Assessment and Plan: No diagnosis found.  Assessment & Plan   Signed Harlene Schroeder, MD

## 2023-12-08 ENCOUNTER — Ambulatory Visit: Admitting: Family Medicine

## 2023-12-08 ENCOUNTER — Encounter: Payer: Self-pay | Admitting: Family Medicine

## 2023-12-08 VITALS — BP 128/72 | HR 71 | Temp 98.0°F | Ht 65.0 in | Wt 267.4 lb

## 2023-12-08 DIAGNOSIS — M952 Other acquired deformity of head: Secondary | ICD-10-CM

## 2023-12-08 DIAGNOSIS — E119 Type 2 diabetes mellitus without complications: Secondary | ICD-10-CM | POA: Diagnosis not present

## 2023-12-08 DIAGNOSIS — Z23 Encounter for immunization: Secondary | ICD-10-CM

## 2023-12-08 NOTE — Addendum Note (Signed)
 Addended by: Daryl Quiros M on: 12/08/2023 04:27 PM   Modules accepted: Orders

## 2023-12-08 NOTE — Patient Instructions (Signed)
 Good to see you today- flu shot given Recommend covid booster at your pharmacy at your convenience I ordered a CT of your skull to be done asap

## 2023-12-17 ENCOUNTER — Ambulatory Visit (HOSPITAL_BASED_OUTPATIENT_CLINIC_OR_DEPARTMENT_OTHER)
Admission: RE | Admit: 2023-12-17 | Discharge: 2023-12-17 | Disposition: A | Discharge status: Home / Self Care | Source: Ambulatory Visit | Attending: Family Medicine | Admitting: Family Medicine

## 2023-12-17 ENCOUNTER — Other Ambulatory Visit (HOSPITAL_BASED_OUTPATIENT_CLINIC_OR_DEPARTMENT_OTHER)

## 2023-12-17 DIAGNOSIS — M952 Other acquired deformity of head: Secondary | ICD-10-CM | POA: Insufficient documentation

## 2023-12-23 ENCOUNTER — Encounter: Payer: Self-pay | Admitting: Family Medicine
